# Patient Record
Sex: Female | Born: 1990 | Race: Black or African American | Hispanic: No | Marital: Married | Smoking: Never smoker
Health system: Southern US, Community
[De-identification: ages and names within clinical notes are randomized; demographics above are authoritative.]

## PROBLEM LIST (undated history)

## (undated) ENCOUNTER — Inpatient Hospital Stay (HOSPITAL_COMMUNITY): Payer: Self-pay

## (undated) DIAGNOSIS — R011 Cardiac murmur, unspecified: Secondary | ICD-10-CM

## (undated) DIAGNOSIS — N83209 Unspecified ovarian cyst, unspecified side: Secondary | ICD-10-CM

## (undated) DIAGNOSIS — D649 Anemia, unspecified: Secondary | ICD-10-CM

## (undated) HISTORY — PX: APPENDECTOMY: SHX54

## (undated) HISTORY — DX: Cardiac murmur, unspecified: R01.1

## (undated) HISTORY — PX: CHOLECYSTECTOMY: SHX55

## (undated) HISTORY — DX: Unspecified ovarian cyst, unspecified side: N83.209

## (undated) HISTORY — PX: HERNIA REPAIR: SHX51

## (undated) HISTORY — DX: Anemia, unspecified: D64.9

---

## 2011-11-18 ENCOUNTER — Ambulatory Visit (INDEPENDENT_AMBULATORY_CARE_PROVIDER_SITE_OTHER): Payer: Managed Care, Other (non HMO) | Admitting: Internal Medicine

## 2011-11-18 VITALS — BP 143/86 | HR 88 | Temp 98.5°F | Resp 16 | Ht 62.0 in | Wt 152.0 lb

## 2011-11-18 DIAGNOSIS — L509 Urticaria, unspecified: Secondary | ICD-10-CM

## 2011-11-18 MED ORDER — CETIRIZINE HCL 10 MG PO TABS: 10.0000 mg | ORAL_TABLET | Freq: Every day | ORAL | Status: DC

## 2011-11-18 NOTE — Progress Notes (Signed)
  Subjective:    Patient ID: Robin Berry, female    DOB: 10-25-1990, 21 y.o.   MRN: [id number]  CC: 21 yo c/o rash  HPI Yesterday the pt noticed a itchy rash on R arm, abdomen and lower extremities.  She has not eaten any new foods, or used any new products.  She has noticed having dry itchy eyes, but denies wheezing, sore throat, runny nose, and having any associated symptoms.  She feels well other than this rash. No fever  Review of Systems Noncontributory    Objective:   Physical Exam General: 21 yo appears calm and cooperative with exam. Vitals:  Filed Vitals:   11/18/11 2021  BP: 143/86  Pulse: 88  Temp: 98.5 F (36.9 C)  Resp: 16  HEENT: Non-traumatic, EOMIT, Ears/Nose normal to external exam, Pharynx pink and without swelling, trachea midline Heart: RRR Lungs: CTA bilaterally w/o wheezing and in no acute respiratory distress MSK: Normal bulk and tone Neuro: Alert, oriented, CN II - XII grossly IT Skin: Fine/diffuse papular rash with mild erythema /no vesicles      Assessment & Plan:  Allergic exanthem? Etiology  Meds ordered this encounter  Medications  . cetirizine (ZYRTEC) 10 MG tablet    Sig: Take 1 tablet (10 mg total) by mouth daily.    Dispense:  30 tablet    Refill:  0   Followup 10-14 days if not resolved

## 2012-08-26 ENCOUNTER — Ambulatory Visit (INDEPENDENT_AMBULATORY_CARE_PROVIDER_SITE_OTHER): Payer: Managed Care, Other (non HMO) | Admitting: Family Medicine

## 2012-08-26 VITALS — BP 118/72 | HR 83 | Temp 98.3°F | Resp 18 | Ht 61.0 in | Wt 155.0 lb

## 2012-08-26 DIAGNOSIS — L509 Urticaria, unspecified: Secondary | ICD-10-CM

## 2012-08-26 LAB — GLUCOSE, POCT (MANUAL RESULT ENTRY): POC Glucose: 87 mg/dl (ref 70–99)

## 2012-08-26 MED ORDER — PREDNISONE 20 MG PO TABS: 40.0000 mg | ORAL_TABLET | Freq: Every day | ORAL | Status: DC

## 2012-08-26 NOTE — Progress Notes (Signed)
Subjective:    Patient ID: Robin Berry, female    DOB: [birth year], 22 y.o.   MRN: [id number]  HPI Robin Berry is a 22 y.o. female  Hives - since 3 days ago.  Initially on neck, now on arms, chest/back and thighs since yesterday. No trouble breathing. Taking benadryl twice per day and benadryl cream  - no relief. Started zyrtec today.   Treated for Urticaria 11/18/11 with Zyrtec.   Review of Systems  Constitutional: Negative for fever and chills.  Respiratory: Negative for chest tightness, shortness of breath and wheezing.   Cardiovascular: Negative for chest pain.       Hx of heart murmur - has appt with specialist next month.  Skin: Positive for rash.        Objective:   Physical Exam  Constitutional: She is oriented to person, place, and time. She appears well-developed and well-nourished. No distress.  HENT:  Head: Normocephalic and atraumatic.  Mouth/Throat: Oropharynx is clear and moist. No oral lesions. No oropharyngeal exudate.  Pulmonary/Chest: Effort normal and breath sounds normal. No stridor. No respiratory distress. She has no wheezes.  Neurological: She is alert and oriented to person, place, and time.  Skin: Skin is warm and dry. Rash noted. No petechiae noted. Rash is urticarial.     Few scattered urticaria - neck, upper back, arms, lower legs, few on face.   Psychiatric: She has a normal mood and affect. Her behavior is normal.    Results for orders placed in visit on 08/26/12  GLUCOSE, POCT (MANUAL RESULT ENTRY)      Result Value Range   POC Glucose 87  70 - 99 mg/dl      Assessment & Plan:  Robin Berry is a 22 y.o. female Hives - Plan: POCT glucose (manual entry), predniSONE (DELTASONE) 20 MG tablet  Progressed today.  Will treat with prednisone 40mg  QD for 5 days - SED. Continue benadryl, add zantac, discussed allergy testing - declined at present. rtc precautions discussed.   Meds ordered this encounter  Medications  .  DiphenhydrAMINE HCl (BENADRYL ALLERGY PO)    Sig: Take by mouth.  . predniSONE (DELTASONE) 20 MG tablet    Sig: Take 2 tablets (40 mg total) by mouth daily.    Dispense:  10 tablet    Refill:  0   Patient Instructions  Ok to continue benadryl - up to every 4-6 hours until hives improve, then cn change to zyrtec - 10mg  once per day.  Zantac 150mg  twice per day until hives improve. Return to the clinic or go to the nearest emergency room if any of your symptoms worsen or new symptoms occur. If symptoms recur - consider allergist evaluation and allergy testing.   Hives Hives are itchy, red, swollen areas of the skin. They can vary in size and location on your body. Hives can come and go for hours or several days (acute hives) or for several weeks (chronic hives). Hives do not spread from person to person (noncontagious). They may get worse with scratching, exercise, and emotional stress. CAUSES   Allergic reaction to food, additives, or drugs.  Infections, including the common cold.  Illness, such as vasculitis, lupus, or thyroid disease.  Exposure to sunlight, heat, or cold.  Exercise.  Stress.  Contact with chemicals. SYMPTOMS   Red or white swollen patches on the skin. The patches may change size, shape, and location quickly and repeatedly.  Itching.  Swelling of the hands, feet, and face. This may occur  if hives develop deeper in the skin. DIAGNOSIS  Your caregiver can usually tell what is wrong by performing a physical exam. Skin or blood tests may also be done to determine the cause of your hives. In some cases, the cause cannot be determined. TREATMENT  Mild cases usually get better with medicines such as antihistamines. Severe cases may require an emergency epinephrine injection. If the cause of your hives is known, treatment includes avoiding that trigger.  HOME CARE INSTRUCTIONS   Avoid causes that trigger your hives.  Take antihistamines as directed by your caregiver  to reduce the severity of your hives. Non-sedating or low-sedating antihistamines are usually recommended. Do not drive while taking an antihistamine.  Take any other medicines prescribed for itching as directed by your caregiver.  Wear loose-fitting clothing.  Keep all follow-up appointments as directed by your caregiver. SEEK MEDICAL CARE IF:   You have persistent or severe itching that is not relieved with medicine.  You have painful or swollen joints. SEEK IMMEDIATE MEDICAL CARE IF:   You have a fever.  Your tongue or lips are swollen.  You have trouble breathing or swallowing.  You feel tightness in the throat or chest.  You have abdominal pain. These problems may be the first sign of a life-threatening allergic reaction. Call your local emergency services (911 in [place] MAKE SURE YOU:   Understand these instructions.  Will watch your condition.  Will get help right away if you are not doing well or get worse. Document Released: 01/07/2005 Document Revised: 07/09/2011 Document Reviewed: 04/02/2011 [facility] Patient Information 2014 [place], [place].

## 2012-08-26 NOTE — Patient Instructions (Signed)
Ok to continue benadryl - up to every 4-6 hours until hives improve, then cn change to zyrtec - 10mg  once per day.  Zantac 150mg  twice per day until hives improve. Return to the clinic or go to the nearest emergency room if any of your symptoms worsen or new symptoms occur. If symptoms recur - consider allergist evaluation and allergy testing.   Hives Hives are itchy, red, swollen areas of the skin. They can vary in size and location on your body. Hives can come and go for hours or several days (acute hives) or for several weeks (chronic hives). Hives do not spread from person to person (noncontagious). They may get worse with scratching, exercise, and emotional stress. CAUSES   Allergic reaction to food, additives, or drugs.  Infections, including the common cold.  Illness, such as vasculitis, lupus, or thyroid disease.  Exposure to sunlight, heat, or cold.  Exercise.  Stress.  Contact with chemicals. SYMPTOMS   Red or white swollen patches on the skin. The patches may change size, shape, and location quickly and repeatedly.  Itching.  Swelling of the hands, feet, and face. This may occur if hives develop deeper in the skin. DIAGNOSIS  Your caregiver can usually tell what is wrong by performing a physical exam. Skin or blood tests may also be done to determine the cause of your hives. In some cases, the cause cannot be determined. TREATMENT  Mild cases usually get better with medicines such as antihistamines. Severe cases may require an emergency epinephrine injection. If the cause of your hives is known, treatment includes avoiding that trigger.  HOME CARE INSTRUCTIONS   Avoid causes that trigger your hives.  Take antihistamines as directed by your caregiver to reduce the severity of your hives. Non-sedating or low-sedating antihistamines are usually recommended. Do not drive while taking an antihistamine.  Take any other medicines prescribed for itching as directed by your  caregiver.  Wear loose-fitting clothing.  Keep all follow-up appointments as directed by your caregiver. SEEK MEDICAL CARE IF:   You have persistent or severe itching that is not relieved with medicine.  You have painful or swollen joints. SEEK IMMEDIATE MEDICAL CARE IF:   You have a fever.  Your tongue or lips are swollen.  You have trouble breathing or swallowing.  You feel tightness in the throat or chest.  You have abdominal pain. These problems may be the first sign of a life-threatening allergic reaction. Call your local emergency services (911 in [place] MAKE SURE YOU:   Understand these instructions.  Will watch your condition.  Will get help right away if you are not doing well or get worse. Document Released: 01/07/2005 Document Revised: 07/09/2011 Document Reviewed: 04/02/2011 [facility] Patient Information 2014 [place], [place].

## 2012-10-26 LAB — OB RESULTS CONSOLE ABO/RH: RH TYPE: POSITIVE

## 2012-10-26 LAB — OB RESULTS CONSOLE RPR: RPR: NONREACTIVE

## 2012-10-26 LAB — OB RESULTS CONSOLE ANTIBODY SCREEN: Antibody Screen: NEGATIVE

## 2012-10-26 LAB — OB RESULTS CONSOLE HIV ANTIBODY (ROUTINE TESTING): HIV: NONREACTIVE

## 2012-10-26 LAB — OB RESULTS CONSOLE HEPATITIS B SURFACE ANTIGEN: Hepatitis B Surface Ag: NEGATIVE

## 2012-10-26 LAB — OB RESULTS CONSOLE RUBELLA ANTIBODY, IGM: Rubella: IMMUNE

## 2012-11-12 LAB — OB RESULTS CONSOLE GC/CHLAMYDIA
Chlamydia: NEGATIVE
GC PROBE AMP, GENITAL: NEGATIVE

## 2012-12-07 ENCOUNTER — Inpatient Hospital Stay (HOSPITAL_COMMUNITY)
Admission: AD | Admit: 2012-12-07 | Discharge: 2012-12-07 | Disposition: A | Discharge status: Home / Self Care | Payer: Managed Care, Other (non HMO) | Source: Ambulatory Visit | Attending: Obstetrics and Gynecology | Admitting: Obstetrics and Gynecology

## 2012-12-07 ENCOUNTER — Inpatient Hospital Stay (HOSPITAL_COMMUNITY): Payer: Managed Care, Other (non HMO)

## 2012-12-07 ENCOUNTER — Encounter (HOSPITAL_COMMUNITY): Payer: Self-pay

## 2012-12-07 DIAGNOSIS — R1013 Epigastric pain: Secondary | ICD-10-CM | POA: Insufficient documentation

## 2012-12-07 DIAGNOSIS — O99891 Other specified diseases and conditions complicating pregnancy: Secondary | ICD-10-CM | POA: Insufficient documentation

## 2012-12-07 DIAGNOSIS — K5900 Constipation, unspecified: Secondary | ICD-10-CM | POA: Insufficient documentation

## 2012-12-07 DIAGNOSIS — K219 Gastro-esophageal reflux disease without esophagitis: Secondary | ICD-10-CM | POA: Insufficient documentation

## 2012-12-07 DIAGNOSIS — O210 Mild hyperemesis gravidarum: Secondary | ICD-10-CM | POA: Insufficient documentation

## 2012-12-07 DIAGNOSIS — Z9089 Acquired absence of other organs: Secondary | ICD-10-CM | POA: Insufficient documentation

## 2012-12-07 LAB — URINALYSIS, ROUTINE W REFLEX MICROSCOPIC
Ketones, ur: NEGATIVE mg/dL
Nitrite: NEGATIVE
Specific Gravity, Urine: 1.02 (ref 1.005–1.030)
Urobilinogen, UA: 1 mg/dL (ref 0.0–1.0)
pH: 7 (ref 5.0–8.0)

## 2012-12-07 LAB — URINE MICROSCOPIC-ADD ON

## 2012-12-07 NOTE — MAU Provider Note (Signed)
History     CSN: [id number]  Arrival date and time: 12/07/12 1707   None     Chief Complaint  Patient presents with  . Abdominal Pain   HPI Comments: G1 @ [redacted]w[redacted]d with spastic epigastric pain beginning at noon today. Emesis x3 throughout the day. Also c/o mild lower abdominal cramping started @ 1700. Went 5 days w/o BM then had small BM this am. H/o chronic constipation since cholecystectomy. No VB or LOF. C/o reflux prior to pregnancy and currently.  Abdominal Pain Associated symptoms include constipation, nausea and vomiting. Pertinent negatives include no diarrhea.    OB History   Grav Para Term Preterm Abortions TAB SAB Ect Mult Living   1               Past Medical History  Diagnosis Date  . Anemia     Past Surgical History  Procedure Laterality Date  . Appendectomy    . Cholecystectomy      History reviewed. No pertinent family history.  History  Substance Use Topics  . Smoking status: Never Smoker   . Smokeless tobacco: Not on file  . Alcohol Use: No    Allergies:  Allergies  Allergen Reactions  . Lemon Flavor Shortness Of Breath and Swelling    Mouth and throat swelling     Prescriptions prior to admission  Medication Sig Dispense Refill  . Prenatal Vit-Fe Fumarate-FA (PRENATAL MULTIVITAMIN) TABS tablet Take 1 tablet by mouth at bedtime.        Review of Systems  Constitutional: Negative.   HENT: Negative.   Eyes: Negative.   Respiratory: Negative.   Cardiovascular: Negative.   Gastrointestinal: Positive for heartburn, nausea, vomiting, abdominal pain and constipation. Negative for diarrhea.  Genitourinary: Negative.   Musculoskeletal: Negative.   Skin: Negative.   Neurological: Negative.   Endo/Heme/Allergies: Negative.   Psychiatric/Behavioral: Negative.    Physical Exam   Blood pressure 136/79, pulse 111, temperature 99.6 F (37.6 C), temperature source Oral, resp. rate 16, height 5' 0.5" (1.537 m), weight 77.565 kg (171 lb), last  menstrual period 09/01/2012, SpO2 98.00%.  Physical Exam  Constitutional: She is oriented to person, place, and time. She appears well-developed and well-nourished.  HENT:  Head: Normocephalic.  Eyes: Pupils are equal, round, and reactive to light.  Neck: Normal range of motion.  Cardiovascular: Normal rate and regular rhythm.   Respiratory: Effort normal and breath sounds normal.  GI: Soft. Bowel sounds are normal. She exhibits distension.  Genitourinary: Vagina normal and uterus normal.  Musculoskeletal: Normal range of motion.  Neurological: She is alert and oriented to person, place, and time.  Skin: Skin is warm and dry.  Psychiatric: She has a normal mood and affect.  SVE: 0/0/high  MAU Course  Procedures Abdominal Ultrasound-  CLINICAL DATA: Right upper abdominal pain, pregnant.  EXAM:  COMPLETE ABDOMINAL ULTRASOUND  COMPARISON: None available  FINDINGS:  Gallbladder: Surgically absent  Common bile duct: Normal in caliber, 1.72mm diameter.  Liver: Homogeneous in echotexture without focal lesion or  intrahepatic bile duct dilatation.  IVC: Negative  Pancreas: Negative  Spleen: No focal lesion, 10.3cm in length.  Right Kidney: No mass or hydronephrosis, 10.0cm in length.  Left Kidney: No lesion or hydronephrosis, 9.7cm in length.  Abdominal aorta: Negative  IMPRESSION:  Negative, post cholecystectomy.  Electronically Signed  By: Oley Balm M.D.  On: 12/07/2012 19:54   Assessment and Plan  [redacted] wk gestation Constipation GERD  Discharge home. Miralax and Magnesium Oxide daily for  constipation. Increase water and fiber intake. Exercise regularly. Zantac daily. SAB precautions discussed.  Eriana Suliman, N 12/07/2012, 6:11 PM

## 2012-12-07 NOTE — MAU Note (Signed)
Patient states that she started having upper abdominal pain at 1230 that is not going away. Has vomited x 3 since the pain started. Denies bleeding or discharge but has started having lower abdominal pain.

## 2012-12-07 NOTE — MAU Note (Signed)
Pt presents to MAU with c/o upper abdominal pain since 1230 today. Pt states it started gradually but became sever in about 30 minutes. Pt has had her gallbladder removed and states this is the same type of pain that she experienced then.

## 2013-03-18 ENCOUNTER — Inpatient Hospital Stay (HOSPITAL_COMMUNITY)
Admission: AD | Admit: 2013-03-18 | Payer: Managed Care, Other (non HMO) | Source: Ambulatory Visit | Admitting: Obstetrics & Gynecology

## 2013-04-30 LAB — OB RESULTS CONSOLE GBS: GBS: NEGATIVE

## 2013-05-19 ENCOUNTER — Inpatient Hospital Stay (HOSPITAL_COMMUNITY)
Admission: AD | Admit: 2013-05-19 | Discharge: 2013-05-19 | Disposition: A | Discharge status: Home / Self Care | Payer: Managed Care, Other (non HMO) | Source: Ambulatory Visit | Attending: Obstetrics & Gynecology | Admitting: Obstetrics & Gynecology

## 2013-05-19 ENCOUNTER — Encounter (HOSPITAL_COMMUNITY): Payer: Self-pay | Admitting: *Deleted

## 2013-05-19 DIAGNOSIS — O368390 Maternal care for abnormalities of the fetal heart rate or rhythm, unspecified trimester, not applicable or unspecified: Secondary | ICD-10-CM | POA: Insufficient documentation

## 2013-05-19 NOTE — MAU Note (Signed)
Sent from office, having decels.  No real pain, feeling pelvic pressure. No bleeding no leaking.

## 2013-05-19 NOTE — MAU Provider Note (Signed)
  History     CSN: [id number]  Arrival date and time: 05/19/13 1025   None    Chief Complaint  Patient presents with  . non-reactive fetal tracing    HPI 38.[redacted] wks gestation, patient was seen in office for her routine Ob visit and complaint of decreased Fms. Office NST noted possible variable decel vs interrupted tracing with maternal heart rate pick up.  Past Medical History  Diagnosis Date  . Anemia     Past Surgical History  Procedure Laterality Date  . Appendectomy    . Cholecystectomy      History reviewed. No pertinent family history.  History  Substance Use Topics  . Smoking status: Never Smoker   . Smokeless tobacco: Not on file  . Alcohol Use: No    Allergies:  Allergies  Allergen Reactions  . Lemon Flavor Shortness Of Breath and Swelling    Mouth and throat swelling     Prescriptions prior to admission  Medication Sig Dispense Refill  . ferrous sulfate 325 (65 FE) MG tablet Take 325 mg by mouth daily.      Marland Kitchen. loratadine (CLARITIN) 10 MG tablet Take 10 mg by mouth as needed for allergies.      . Prenatal Vit-Fe Fumarate-FA (PRENATAL MULTIVITAMIN) TABS tablet Take 1 tablet by mouth at bedtime.        ROS Physical Exam   Height 5\' 1"  (1.549 m), weight 199 lb (90.266 kg), last menstrual period 09/01/2012.  Physical Exam  A&O x 3, no acute distress. Pleasant FHT  - Category i. NST reactive.  Toco present, occasional.  MAU Course  Procedures Prolonged fetal monitoring with NO decels.    Assessment and Plan  Reassuring FHT and fetal status, NST reactive. See me in office on 5/1  Robley FriesVaishali R Latrice Storlie 05/19/2013, 12:31 PM

## 2013-05-25 ENCOUNTER — Encounter (HOSPITAL_COMMUNITY): Payer: Self-pay | Admitting: *Deleted

## 2013-05-25 ENCOUNTER — Encounter (HOSPITAL_COMMUNITY): Payer: Self-pay | Admitting: General Practice

## 2013-05-25 ENCOUNTER — Inpatient Hospital Stay (HOSPITAL_COMMUNITY)
Admission: AD | Admit: 2013-05-25 | Discharge: 2013-05-25 | Disposition: A | Discharge status: Home / Self Care | Payer: Managed Care, Other (non HMO) | Source: Ambulatory Visit | Attending: Obstetrics | Admitting: Obstetrics

## 2013-05-25 ENCOUNTER — Inpatient Hospital Stay (HOSPITAL_COMMUNITY)
Admission: AD | Admit: 2013-05-25 | Discharge: 2013-05-26 | Disposition: A | Discharge status: Home / Self Care | Payer: Managed Care, Other (non HMO) | Source: Ambulatory Visit | Attending: Obstetrics | Admitting: Obstetrics

## 2013-05-25 DIAGNOSIS — O479 False labor, unspecified: Secondary | ICD-10-CM | POA: Insufficient documentation

## 2013-05-25 NOTE — MAU Note (Signed)
Pt was seen by DR. Mody today in the office at 1000am. Pt was checked by MD and was told she was 2 cm. Pt states she has been leaking fluid since 8:00 pm last night. States on test in the office was positive and one was  Negative for rom.

## 2013-05-25 NOTE — MAU Note (Signed)
Pt states she started having contractions 3am today

## 2013-05-25 NOTE — Progress Notes (Signed)
CC: labor check.  HPI: 23 yo G1 at 39'2 presents w/ ctx since 3 am. Pt notes ctx q 10-15 min. Pt notes leaking fluid earlier this am, none currently. Seen in office earlier today.- fern/ pool/ nitrizine neg and cvx at 2 cm. Pt sent walking and returns now for re-eval. Good FM, no VB.  PMH: anemia, constipation, anal fissures PSH: chole, appy PN issues: eleavted wt gain 40+#, GBS neg, nl DS, nl quaD  Pe: Filed Vitals:   05/25/13 1439 05/25/13 1444 05/25/13 1449 05/25/13 1454  BP:      Pulse: 99 93 93 102  Temp:      TempSrc:      Resp:      Gen: well appearing, no distress Abd: gravid, NT LE: NT, no edema Cvx: 2/50%/ vtx -3/ mid/ med/ intact membranes palpated GU: no pool, gel and cervical mucous noted  Toco: q 5min FH: 140s, + accels, no decels, 10 beat var  Fern: neg  A/P: Latent labor - reasurring fetal testing GBS neg Expectant management Awaiting full vital- asked nurse to check now.   Olita Takeshita A. Neng Albee 05/25/2013 3:21 PM

## 2013-05-25 NOTE — Discharge Instructions (Signed)

## 2013-05-26 ENCOUNTER — Other Ambulatory Visit: Payer: Self-pay | Admitting: Obstetrics & Gynecology

## 2013-05-26 MED ORDER — ZOLPIDEM TARTRATE 5 MG PO TABS
5.0000 mg | ORAL_TABLET | Freq: Once | ORAL | Status: AC
  Administered 2013-05-26: 5 mg via ORAL
  Filled 2013-05-26: qty 1

## 2013-05-26 NOTE — Progress Notes (Signed)
Pt lying on left side for comfort while BP taking.

## 2013-05-26 NOTE — Progress Notes (Signed)
Pt self positioning from right side lying position to left side lying position

## 2013-05-26 NOTE — Discharge Instructions (Signed)

## 2013-05-27 ENCOUNTER — Inpatient Hospital Stay (HOSPITAL_COMMUNITY)
Admission: RE | Admit: 2013-05-27 | Discharge: 2013-05-30 | DRG: 765 | Disposition: A | Discharge status: Home / Self Care | Payer: Managed Care, Other (non HMO) | Source: Ambulatory Visit | Attending: Obstetrics & Gynecology | Admitting: Obstetrics & Gynecology

## 2013-05-27 ENCOUNTER — Encounter (HOSPITAL_COMMUNITY): Payer: Self-pay

## 2013-05-27 ENCOUNTER — Encounter (HOSPITAL_COMMUNITY): Payer: Managed Care, Other (non HMO) | Admitting: Anesthesiology

## 2013-05-27 ENCOUNTER — Encounter (HOSPITAL_COMMUNITY)
Admission: RE | Disposition: A | Discharge status: Home / Self Care | Payer: Self-pay | Source: Ambulatory Visit | Attending: Obstetrics & Gynecology

## 2013-05-27 ENCOUNTER — Inpatient Hospital Stay (HOSPITAL_COMMUNITY): Payer: Managed Care, Other (non HMO) | Admitting: Anesthesiology

## 2013-05-27 DIAGNOSIS — O99214 Obesity complicating childbirth: Secondary | ICD-10-CM

## 2013-05-27 DIAGNOSIS — O409XX0 Polyhydramnios, unspecified trimester, not applicable or unspecified: Principal | ICD-10-CM | POA: Diagnosis present

## 2013-05-27 DIAGNOSIS — O9902 Anemia complicating childbirth: Secondary | ICD-10-CM | POA: Diagnosis present

## 2013-05-27 DIAGNOSIS — Z6837 Body mass index (BMI) 37.0-37.9, adult: Secondary | ICD-10-CM

## 2013-05-27 DIAGNOSIS — E669 Obesity, unspecified: Secondary | ICD-10-CM | POA: Diagnosis present

## 2013-05-27 DIAGNOSIS — O332 Maternal care for disproportion due to inlet contraction of pelvis: Secondary | ICD-10-CM | POA: Diagnosis present

## 2013-05-27 DIAGNOSIS — O411090 Infection of amniotic sac and membranes, unspecified, unspecified trimester, not applicable or unspecified: Secondary | ICD-10-CM | POA: Diagnosis present

## 2013-05-27 DIAGNOSIS — D649 Anemia, unspecified: Secondary | ICD-10-CM | POA: Diagnosis present

## 2013-05-27 LAB — CBC
HCT: 35 % — ABNORMAL LOW (ref 36.0–46.0)
Hemoglobin: 11.8 g/dL — ABNORMAL LOW (ref 12.0–15.0)
MCH: 30.9 pg (ref 26.0–34.0)
MCHC: 33.7 g/dL (ref 30.0–36.0)
MCV: 91.6 fL (ref 78.0–100.0)
Platelets: 210 10*3/uL (ref 150–400)
RBC: 3.82 MIL/uL — ABNORMAL LOW (ref 3.87–5.11)
RDW: 12.9 % (ref 11.5–15.5)
WBC: 12.1 10*3/uL — ABNORMAL HIGH (ref 4.0–10.5)

## 2013-05-27 LAB — TYPE AND SCREEN
ABO/RH(D): A POS
ANTIBODY SCREEN: NEGATIVE

## 2013-05-27 LAB — RPR

## 2013-05-27 LAB — ABO/RH: ABO/RH(D): A POS

## 2013-05-27 SURGERY — Surgical Case
Anesthesia: Epidural | Site: Abdomen

## 2013-05-27 MED ORDER — BUTORPHANOL TARTRATE 1 MG/ML IJ SOLN
1.0000 mg | INTRAMUSCULAR | Status: DC | PRN
  Administered 2013-05-27 (×2): 1 mg via INTRAVENOUS
  Filled 2013-05-27 (×2): qty 1

## 2013-05-27 MED ORDER — OXYTOCIN 40 UNITS IN LACTATED RINGERS INFUSION - SIMPLE MED
1.0000 m[IU]/min | INTRAVENOUS | Status: DC
  Administered 2013-05-27: 4 m[IU]/min via INTRAVENOUS
  Filled 2013-05-27: qty 1000

## 2013-05-27 MED ORDER — OXYTOCIN 10 UNIT/ML IJ SOLN
INTRAMUSCULAR | Status: AC
  Filled 2013-05-27: qty 4

## 2013-05-27 MED ORDER — MORPHINE SULFATE 0.5 MG/ML IJ SOLN
INTRAMUSCULAR | Status: AC
  Filled 2013-05-27: qty 10

## 2013-05-27 MED ORDER — ONDANSETRON HCL 4 MG/2ML IJ SOLN: 4.0000 mg | Freq: Four times a day (QID) | INTRAMUSCULAR | Status: DC | PRN

## 2013-05-27 MED ORDER — SODIUM BICARBONATE 8.4 % IV SOLN
INTRAVENOUS | Status: AC
  Filled 2013-05-27: qty 50

## 2013-05-27 MED ORDER — PHENYLEPHRINE 40 MCG/ML (10ML) SYRINGE FOR IV PUSH (FOR BLOOD PRESSURE SUPPORT): 80.0000 ug | PREFILLED_SYRINGE | INTRAVENOUS | Status: DC | PRN

## 2013-05-27 MED ORDER — FLEET ENEMA 7-19 GM/118ML RE ENEM: 1.0000 | ENEMA | RECTAL | Status: DC | PRN

## 2013-05-27 MED ORDER — FENTANYL CITRATE 0.05 MG/ML IJ SOLN
INTRAMUSCULAR | Status: DC | PRN
  Administered 2013-05-27: 100 ug via EPIDURAL

## 2013-05-27 MED ORDER — FENTANYL CITRATE 0.05 MG/ML IJ SOLN
INTRAMUSCULAR | Status: AC
  Filled 2013-05-27: qty 2

## 2013-05-27 MED ORDER — LIDOCAINE HCL (PF) 1 % IJ SOLN: 30.0000 mL | INTRAMUSCULAR | Status: DC | PRN

## 2013-05-27 MED ORDER — ONDANSETRON HCL 4 MG/2ML IJ SOLN
INTRAMUSCULAR | Status: AC
  Filled 2013-05-27: qty 2

## 2013-05-27 MED ORDER — CITRIC ACID-SODIUM CITRATE 334-500 MG/5ML PO SOLN
30.0000 mL | ORAL | Status: DC | PRN
  Administered 2013-05-27: 30 mL via ORAL
  Filled 2013-05-27: qty 15

## 2013-05-27 MED ORDER — ACETAMINOPHEN 325 MG PO TABS
650.0000 mg | ORAL_TABLET | ORAL | Status: DC | PRN
  Administered 2013-05-27: 650 mg via ORAL
  Filled 2013-05-27: qty 2

## 2013-05-27 MED ORDER — BUPIVACAINE HCL (PF) 0.25 % IJ SOLN: INTRAMUSCULAR | Status: DC | PRN

## 2013-05-27 MED ORDER — IBUPROFEN 600 MG PO TABS: 600.0000 mg | ORAL_TABLET | Freq: Four times a day (QID) | ORAL | Status: DC | PRN

## 2013-05-27 MED ORDER — OXYTOCIN 40 UNITS IN LACTATED RINGERS INFUSION - SIMPLE MED: 62.5000 mL/h | INTRAVENOUS | Status: DC

## 2013-05-27 MED ORDER — OXYCODONE-ACETAMINOPHEN 5-325 MG PO TABS: 1.0000 | ORAL_TABLET | ORAL | Status: DC | PRN

## 2013-05-27 MED ORDER — LACTATED RINGERS IV SOLN
INTRAVENOUS | Status: DC
  Administered 2013-05-27 (×4): via INTRAVENOUS

## 2013-05-27 MED ORDER — FENTANYL CITRATE 0.05 MG/ML IJ SOLN
INTRAMUSCULAR | Status: DC | PRN
Start: 2013-05-27 — End: 2013-05-27
  Administered 2013-05-27: 100 ug via INTRAVENOUS

## 2013-05-27 MED ORDER — ONDANSETRON HCL 4 MG/2ML IJ SOLN
INTRAMUSCULAR | Status: DC | PRN
  Administered 2013-05-27: 4 mg via INTRAVENOUS

## 2013-05-27 MED ORDER — DIPHENHYDRAMINE HCL 50 MG/ML IJ SOLN: 12.5000 mg | INTRAMUSCULAR | Status: DC | PRN

## 2013-05-27 MED ORDER — LIDOCAINE-EPINEPHRINE (PF) 2 %-1:200000 IJ SOLN
INTRAMUSCULAR | Status: AC
  Filled 2013-05-27: qty 20

## 2013-05-27 MED ORDER — PHENYLEPHRINE 40 MCG/ML (10ML) SYRINGE FOR IV PUSH (FOR BLOOD PRESSURE SUPPORT)
80.0000 ug | PREFILLED_SYRINGE | INTRAVENOUS | Status: DC | PRN
  Filled 2013-05-27: qty 10

## 2013-05-27 MED ORDER — LACTATED RINGERS IV SOLN
500.0000 mL | INTRAVENOUS | Status: DC | PRN
  Administered 2013-05-27: 500 mL via INTRAVENOUS

## 2013-05-27 MED ORDER — FENTANYL 2.5 MCG/ML BUPIVACAINE 1/10 % EPIDURAL INFUSION (WH - ANES)
14.0000 mL/h | INTRAMUSCULAR | Status: DC | PRN
  Administered 2013-05-27 (×3): 14 mL/h via EPIDURAL
  Filled 2013-05-27 (×3): qty 125

## 2013-05-27 MED ORDER — MORPHINE SULFATE (PF) 0.5 MG/ML IJ SOLN
INTRAMUSCULAR | Status: DC | PRN
  Administered 2013-05-27: 3.5 mg via EPIDURAL

## 2013-05-27 MED ORDER — TERBUTALINE SULFATE 1 MG/ML IJ SOLN: 0.2500 mg | Freq: Once | INTRAMUSCULAR | Status: AC | PRN

## 2013-05-27 MED ORDER — OXYTOCIN BOLUS FROM INFUSION
500.0000 mL | INTRAVENOUS | Status: DC
Start: 2013-05-27 — End: 2013-05-28

## 2013-05-27 MED ORDER — SODIUM BICARBONATE 8.4 % IV SOLN
INTRAVENOUS | Status: DC | PRN
  Administered 2013-05-27 (×6): 5 mL via EPIDURAL

## 2013-05-27 MED ORDER — EPHEDRINE 5 MG/ML INJ: 10.0000 mg | INTRAVENOUS | Status: DC | PRN

## 2013-05-27 MED ORDER — FENTANYL 2.5 MCG/ML BUPIVACAINE 1/10 % EPIDURAL INFUSION (WH - ANES): 14.0000 mL/h | INTRAMUSCULAR | Status: DC | PRN

## 2013-05-27 MED ORDER — DEXTROSE 5 % IV SOLN
2.0000 g | Freq: Four times a day (QID) | INTRAVENOUS | Status: DC
  Administered 2013-05-27: 2 g via INTRAVENOUS
  Filled 2013-05-27 (×4): qty 2

## 2013-05-27 MED ORDER — LACTATED RINGERS IV SOLN: 500.0000 mL | Freq: Once | INTRAVENOUS | Status: DC

## 2013-05-27 MED ORDER — EPHEDRINE 5 MG/ML INJ
10.0000 mg | INTRAVENOUS | Status: DC | PRN
  Filled 2013-05-27: qty 4

## 2013-05-27 MED ORDER — MORPHINE SULFATE (PF) 0.5 MG/ML IJ SOLN
INTRAMUSCULAR | Status: DC | PRN
  Administered 2013-05-27: 1.5 mg via EPIDURAL

## 2013-05-27 SURGICAL SUPPLY — 38 items
BENZOIN TINCTURE PRP APPL 2/3 (GAUZE/BANDAGES/DRESSINGS) ×3 IMPLANT
CLAMP CORD UMBIL (MISCELLANEOUS) IMPLANT
CLOSURE WOUND 1/2 X4 (GAUZE/BANDAGES/DRESSINGS) ×1
CLOTH BEACON ORANGE TIMEOUT ST (SAFETY) ×3 IMPLANT
CONTAINER PREFILL 10% NBF 15ML (MISCELLANEOUS) IMPLANT
DRAPE LG THREE QUARTER DISP (DRAPES) IMPLANT
DRSG OPSITE POSTOP 4X10 (GAUZE/BANDAGES/DRESSINGS) ×3 IMPLANT
DURAPREP 26ML APPLICATOR (WOUND CARE) ×3 IMPLANT
ELECT REM PT RETURN 9FT ADLT (ELECTROSURGICAL) ×3
ELECTRODE REM PT RTRN 9FT ADLT (ELECTROSURGICAL) ×1 IMPLANT
EXTRACTOR VACUUM KIWI (MISCELLANEOUS) IMPLANT
EXTRACTOR VACUUM M CUP 4 TUBE (SUCTIONS) IMPLANT
EXTRACTOR VACUUM M CUP 4' TUBE (SUCTIONS)
GLOVE BIO SURGEON STRL SZ7 (GLOVE) ×3 IMPLANT
GLOVE BIOGEL PI IND STRL 7.0 (GLOVE) ×1 IMPLANT
GLOVE BIOGEL PI INDICATOR 7.0 (GLOVE) ×2
GOWN STRL REUS W/TWL LRG LVL3 (GOWN DISPOSABLE) ×6 IMPLANT
KIT ABG SYR 3ML LUER SLIP (SYRINGE) IMPLANT
NEEDLE HYPO 25X5/8 SAFETYGLIDE (NEEDLE) IMPLANT
NS IRRIG 1000ML POUR BTL (IV SOLUTION) ×3 IMPLANT
PACK C SECTION WH (CUSTOM PROCEDURE TRAY) ×3 IMPLANT
PAD OB MATERNITY 4.3X12.25 (PERSONAL CARE ITEMS) ×3 IMPLANT
RTRCTR C-SECT PINK 25CM LRG (MISCELLANEOUS) IMPLANT
STAPLER VISISTAT 35W (STAPLE) IMPLANT
STRIP CLOSURE SKIN 1/2X4 (GAUZE/BANDAGES/DRESSINGS) ×2 IMPLANT
SUT MON AB-0 CT1 36 (SUTURE) ×9 IMPLANT
SUT PLAIN 0 NONE (SUTURE) IMPLANT
SUT PLAIN 2 0 (SUTURE) ×2
SUT PLAIN ABS 2-0 CT1 27XMFL (SUTURE) ×1 IMPLANT
SUT VIC AB 0 CT1 27 (SUTURE) ×4
SUT VIC AB 0 CT1 27XBRD ANBCTR (SUTURE) ×2 IMPLANT
SUT VIC AB 2-0 CT1 27 (SUTURE) ×4
SUT VIC AB 2-0 CT1 TAPERPNT 27 (SUTURE) ×2 IMPLANT
SUT VIC AB 4-0 KS 27 (SUTURE) ×3 IMPLANT
SUT VICRYL 0 TIES 12 18 (SUTURE) IMPLANT
TOWEL OR 17X24 6PK STRL BLUE (TOWEL DISPOSABLE) ×3 IMPLANT
TRAY FOLEY CATH 14FR (SET/KITS/TRAYS/PACK) IMPLANT
WATER STERILE IRR 1000ML POUR (IV SOLUTION) ×3 IMPLANT

## 2013-05-27 NOTE — Op Note (Signed)
Procedure Note : Primary Low Transverse Cesarean Section   Robin Berry  05/27/2013  Indications: Arrest of dilatation  Pre-operative Diagnosis: 39.4 wks polyhydramnios, Chorioamnionitis, Arrest of Dilatation  Post-operative Diagnosis: Same, Platypelloid pelvis  Surgeon: Robley FriesVaishali R Adalbert Alberto, MD    Assistants: Marlinda Mikeanya Bailey, CNM  Anesthesia: epidural   Procedure Details:  The patient was seen in the Labor Room. The risks, benefits, complications, treatment options, and expected outcomes were discussed with the patient. The patient concurred with the proposed plan, giving informed consent. identified as Robin Berry and the procedure verified as C-Section Delivery. She was brought to the Operating room. A Time Out was held and the above information confirmed.  After induction of epidural anesthesia, the patient was draped and prepped in the usual sterile manner. A Pfannenstiel Incision was made and carried down through the subcutaneous tissue to the fascia. Fascial incision was made and extended transversely. The fascia was separated from the underlying rectus tissue superiorly and inferiorly. The peritoneum was identified and entered. Peritoneal incision was extended longitudinally. The utero-vesical peritoneal reflection was incised transversely and the bladder flap was bluntly freed from the lower uterine segment. Bladder blade would not sit well due to flat/platypelloid pelvis so Alexis-O retractor was placed. A low transverse uterine incision was made. Copious clear fluid drained. Baby was asynclitic in LOT position. Pubic arch was flat. Baby GIRL was delivered with gentle rotation and flexion at 10.32 pm on 05/27/13 with Apgar scores of 8 at one minute and 9 at five minutes. Cord ph was not sent the umbilical cord was clamped and cut cord blood was obtained for evaluation. The placenta was removed Intact and appeared normal. The uterine outline, tubes and ovaries appeared normal. The uterine  incision was closed with running locked sutures of 0Monocryl and a second imbricating layer with same suture.  Hemostasis was observed. Alexis-O retractor was removed. Peritoneal closure done with 2-0 Vicryl. Muscles approximated in lower tendon area. The fascia was then reapproximated with running sutures of 0Vicryl. The subcutaneous closure was performed using 2-0plain gut. The skin was closed with 4-0Vicryl. Steristrips, dressings applied.   Instrument, sponge, and needle counts were correct prior the abdominal closure and were correct at the conclusion of the case.   Findings: Baby GIRL, in LOT position with hyperextended asynclitic head with caput. 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 Delivered at 10.32 pm, vigorous cry and Apgars 8 and 9 at 1minute and 5minutes. Normal tubes, ovaries and placenta with 3 vessel cord.    Estimated Blood Loss: 800 cc  Total IV Fluids: 1100 ml   Urine Output: 100CC OF clear urine  Specimens: Placenta to pathology  Complications: no complications  Disposition: PACU - hemodynamically stable.   Maternal Condition: stable   Baby condition / location:  Couplet care / Skin to Skin  Attending Attestation: I performed the procedure.   Signed: Surgeon(s): Robley FriesVaishali R Nelma Phagan, MD

## 2013-05-27 NOTE — Consult Note (Signed)
[facility]  Delivery Note:  C-section       05/27/2013  10:26 PM  I was called to the operating room at the request of the patient's obstetrician (Dr. Juliene PinaMody) for a primary c-section for arrest of dilation.  PRENATAL HX:  23 y/o G1P0 at 239 and 4/7 weeks.  Pregnancy complicated by recent diagnosis of polyhydramnios.   INTRAPARTUM HX:   IOL at 8239 and 4/[redacted] weeks gestation admitted with prodromal labor for labor augmentation.  GBS negative.  C-section was indicated for arrest of dilation.  She also developed chorioamnionitis.    DELIVERY:  Infant was vigorous at delivery, requiring no resuscitation other than standard warming, drying and stimulation.  APGARs 8 and 9.  Mother diagnosed with chorioamnionitis but exam within normal limits.  After 5 minutes, baby left with nurse to assist parents with skin-to-skin care.   _____________________ Electronically Signed By: Maryan CharLindsey Jayvian Escoe, MD Neonatologist

## 2013-05-27 NOTE — Progress Notes (Signed)
Steffanie RainwaterShontria Chesson is a 23 y.o. G1P0 at 303w4d by   Objective: BP 115/65  Pulse 124  Temp(Src) 101.7 F (38.7 C) (Oral)  Resp 20  Ht 5\' 1"  (1.549 m)  Wt 198 lb (89.812 kg)  BMI 37.43 kg/m2  SpO2 100%  LMP 09/01/2012    FHT:  FHR: 155 bpm, variability: moderate,  accelerations:  Present,  decelerations:  Absent UC:   regular, every 2 minutes SVE:   Dilation: 6 Effacement (%): 80 Station: -1 Exam by:: Dr.Rashawna Scoles Maternal fever 101, FHR change in baseline to 155 but category I   Assessment / Plan: Arrest in active phase of labor at 5- 6 cm with OT, caput noted. Chorioamnionitis, starting Tylenol and Cefoxitin  Fetal Wellbeing:  Category I Pain Control:  Epidural I/D:  n/a Anticipated MOD:  Primary C/section Risks/complications of surgery reviewed incl infection, bleeding, damage to internal organs including bladder, bowels, ureters, blood vessels, other risks from anesthesia, VTE and delayed complications of any surgery, complications in future surgery reviewed. Also discussed neonatal complications incl difficult delivery, laceration, vacuum assistance, TTN etc. Pt understands and agrees, all concerns addressed.    Robley FriesVaishali R Meshawn Oconnor 05/27/2013, 9:18 PM

## 2013-05-27 NOTE — Anesthesia Preprocedure Evaluation (Addendum)
Anesthesia Evaluation  Patient identified by MRN, date of birth, ID band Patient awake    Reviewed: Allergy & Precautions, H&P , Patient's Chart, lab work & pertinent test results  Airway Mallampati: II TM Distance: >3 FB Neck ROM: full    Dental  (+) Teeth Intact   Pulmonary  breath sounds clear to auscultation        Cardiovascular Rhythm:regular Rate:Normal     Neuro/Psych    GI/Hepatic   Endo/Other  Morbid obesity  Renal/GU      Musculoskeletal   Abdominal   Peds  Hematology   Anesthesia Other Findings       Reproductive/Obstetrics (+) Pregnancy (FTP --> C/S)                          Anesthesia Physical Anesthesia Plan  ASA: III and emergent  Anesthesia Plan: Epidural   Post-op Pain Management:    Induction:   Airway Management Planned:   Additional Equipment:   Intra-op Plan:   Post-operative Plan:   Informed Consent: I have reviewed the patients History and Physical, chart, labs and discussed the procedure including the risks, benefits and alternatives for the proposed anesthesia with the patient or authorized representative who has indicated his/her understanding and acceptance.   Dental Advisory Given  Plan Discussed with: Surgeon and CRNA  Anesthesia Plan Comments: (Labs checked- platelets confirmed with RN in room. Fetal heart tracing, per RN, reported to be stable enough for sitting procedure. Discussed epidural, and patient consents to the procedure:  included risk of possible headache,backache, failed block, allergic reaction, and nerve injury. This patient was asked if she had any questions or concerns before the procedure started.)       Anesthesia Quick Evaluation

## 2013-05-27 NOTE — Progress Notes (Signed)
Robin Berry is a 23 y.o. G1P0 at 7560w4d admitted in early labor but was planned to have IOL this morning for polyhydramnios. On pitocin (low dose) and s/p Epidural, comfortable.   Objective: BP 118/71  Pulse 98  Temp(Src) 98.8 F (37.1 C) (Oral)  Resp 18  Ht 5\' 1"  (1.549 m)  Wt 198 lb (89.812 kg)  BMI 37.43 kg/m2  SpO2 100%  LMP 09/01/2012    FHT:  FHR: 140s bpm, variability: moderate,  accelerations:  Present,  decelerations:  Absent UC:   regular, every 3-4 minutes SVE:   Dilation: 4 Effacement (%): 80 Station: -3 Exam by:: Dr Juliene PinaMody IUPC placed  Labs: Lab Results  Component Value Date   WBC 12.1* 05/27/2013   HGB 11.8* 05/27/2013   HCT 35.0* 05/27/2013   MCV 91.6 05/27/2013   PLT 210 05/27/2013    Assessment / Plan: Augmentation of labor, progressing well- but in latent labor  Labor: protracted latent phase Preeclampsia:  none Fetal Wellbeing:  Category I Pain Control:  Epidural I/D:  GBS(-) Anticipated MOD:  Guarded for VD  Robley FriesVaishali R Nekhi Berry 05/27/2013

## 2013-05-27 NOTE — Transfer of Care (Signed)
Immediate Anesthesia Transfer of Care Note  Patient: Robin Berry  Procedure(s) Performed: Procedure(s): CESAREAN SECTION (N/A)  Patient Location: PACU  Anesthesia Type:Epidural  Level of Consciousness: awake, alert  and oriented  Airway & Oxygen Therapy: Patient Spontanous Breathing  Post-op Assessment: Report given to PACU RN and Post -op Vital signs reviewed and stable  Post vital signs: Reviewed and stable  Complications: No apparent anesthesia complications

## 2013-05-27 NOTE — H&P (Signed)
Steffanie RainwaterShontria Chesson is a 23 y.o. female G1 at 39.4 wks with polyhydramnios and prodromal labor for 3 days with several office and MAU visits for labor check. Office sonogram on 5/6 for AFI- was 27 cm, Vtx and hence decision was made for labor induction, cx was favorable at 2 cm. She presented earlier this morning for labor pain and was kept in house to proceed with labor augmentation. She received 2 doses of Stadol to rest and felt better. Pitocin was started this morning.   [facility], primary Dr Juliene PinaMody. Uncomplicated preg, TWG 40 lbs, Normal Glucola 111.   History OB History   Grav Para Term Preterm Abortions TAB SAB Ect Mult Living   1              Past Medical History  Diagnosis Date  . Anemia    Past Surgical History  Procedure Laterality Date  . Appendectomy    . Cholecystectomy     Family History: family history is not on file. Social History:  reports that she has never smoked. She does not have any smokeless tobacco history on file. She reports that she does not drink alcohol. Her drug history is not on file.   Prenatal Transfer Tool  Maternal Diabetes: No Genetic Screening: Normal QUAD normal  Maternal Ultrasounds/Referrals: Normal Fetal Ultrasounds or other Referrals:  None Maternal Substance Abuse:  No Significant Maternal Medications:  None Significant Maternal Lab Results:  Lab values include: Group B Strep negative Other Comments:  None  ROS neg   Blood pressure 119/54, pulse 111, temperature 98.2 F (36.8 C), temperature source Oral, resp. rate 20, height 5\' 1"  (1.549 m), weight 198 lb (89.812 kg), last menstrual period 09/01/2012, SpO2 100.00%. Exam Physical Exam  A&O x 3, no acute distress. Pleasant HEENT neg, no thyromegaly Lungs CTA bilat CV RRR, S1S2 normal Abdo soft, non tender, non acute Extr no edema/ tenderness Pelvic 2-3./60%/-3/ VTX FHT 140s/ + accels/ no decels/ mod variab- reassuring. Category I Toco q 2-3 minutes on 2 mu  pitocin. Controlled AROM done, clear copious fluid, Head well applied to cervix. Pitocin stopped and will restart if UCs spaced out.   Prenatal labs: ABO, Rh: --/--/A POS, A POS (05/07 0130) Antibody: NEG (05/07 0130) Rubella: Immune (10/06 0000) RPR: NON REAC (05/07 0130)  HBsAg: Negative (10/06 0000)  HIV: Non-reactive (10/06 0000)  GBS: Negative (04/10 0000)   Assessment/Plan: 23 yo, G1 at 39.4 wks with polyhydramnios, admitted for labor augmentation. GBS(-). Epidural ok, cont pitocin after UCs space out. Watch descent, EFW 8 lbs.    Robley FriesVaishali R Adrienne Delay 05/27/2013

## 2013-05-28 ENCOUNTER — Encounter (HOSPITAL_COMMUNITY): Payer: Self-pay

## 2013-05-28 LAB — CBC
HCT: 29.7 % — ABNORMAL LOW (ref 36.0–46.0)
HEMOGLOBIN: 9.8 g/dL — AB (ref 12.0–15.0)
MCH: 30.3 pg (ref 26.0–34.0)
MCHC: 33 g/dL (ref 30.0–36.0)
MCV: 92 fL (ref 78.0–100.0)
Platelets: 170 10*3/uL (ref 150–400)
RBC: 3.23 MIL/uL — AB (ref 3.87–5.11)
RDW: 13 % (ref 11.5–15.5)
WBC: 21.6 10*3/uL — AB (ref 4.0–10.5)

## 2013-05-28 MED ORDER — LANOLIN HYDROUS EX OINT: 1.0000 "application " | TOPICAL_OINTMENT | CUTANEOUS | Status: DC | PRN

## 2013-05-28 MED ORDER — NALOXONE HCL 1 MG/ML IJ SOLN
1.0000 ug/kg/h | INTRAMUSCULAR | Status: DC | PRN
  Filled 2013-05-28: qty 2

## 2013-05-28 MED ORDER — OXYCODONE-ACETAMINOPHEN 5-325 MG PO TABS
1.0000 | ORAL_TABLET | ORAL | Status: DC | PRN
  Administered 2013-05-28 – 2013-05-30 (×8): 1 via ORAL
  Filled 2013-05-28 (×8): qty 1

## 2013-05-28 MED ORDER — DEXTROSE 5 % IV SOLN
2.0000 g | Freq: Four times a day (QID) | INTRAVENOUS | Status: AC
  Administered 2013-05-28 (×4): 2 g via INTRAVENOUS
  Filled 2013-05-28 (×4): qty 2

## 2013-05-28 MED ORDER — ZOLPIDEM TARTRATE 5 MG PO TABS: 5.0000 mg | ORAL_TABLET | Freq: Every evening | ORAL | Status: DC | PRN

## 2013-05-28 MED ORDER — ONDANSETRON HCL 4 MG/2ML IJ SOLN: 4.0000 mg | Freq: Three times a day (TID) | INTRAMUSCULAR | Status: DC | PRN

## 2013-05-28 MED ORDER — MEPERIDINE HCL 25 MG/ML IJ SOLN: 6.2500 mg | INTRAMUSCULAR | Status: DC | PRN

## 2013-05-28 MED ORDER — OXYTOCIN 40 UNITS IN LACTATED RINGERS INFUSION - SIMPLE MED: 62.5000 mL/h | INTRAVENOUS | Status: AC

## 2013-05-28 MED ORDER — SCOPOLAMINE 1 MG/3DAYS TD PT72
MEDICATED_PATCH | TRANSDERMAL | Status: AC
  Filled 2013-05-28: qty 1

## 2013-05-28 MED ORDER — FENTANYL CITRATE 0.05 MG/ML IJ SOLN: 25.0000 ug | INTRAMUSCULAR | Status: DC | PRN

## 2013-05-28 MED ORDER — DIPHENHYDRAMINE HCL 50 MG/ML IJ SOLN: 25.0000 mg | INTRAMUSCULAR | Status: DC | PRN

## 2013-05-28 MED ORDER — DIPHENHYDRAMINE HCL 25 MG PO CAPS
25.0000 mg | ORAL_CAPSULE | ORAL | Status: DC | PRN
  Filled 2013-05-28: qty 1

## 2013-05-28 MED ORDER — NALBUPHINE HCL 10 MG/ML IJ SOLN
5.0000 mg | INTRAMUSCULAR | Status: DC | PRN
  Administered 2013-05-28: 10 mg via INTRAVENOUS
  Filled 2013-05-28: qty 1

## 2013-05-28 MED ORDER — SENNOSIDES-DOCUSATE SODIUM 8.6-50 MG PO TABS
2.0000 | ORAL_TABLET | ORAL | Status: DC
Start: 2013-05-28 — End: 2013-05-30
  Administered 2013-05-29 – 2013-05-30 (×2): 2 via ORAL
  Filled 2013-05-28 (×2): qty 2

## 2013-05-28 MED ORDER — MENTHOL 3 MG MT LOZG: 1.0000 | LOZENGE | OROMUCOSAL | Status: DC | PRN

## 2013-05-28 MED ORDER — PRENATAL MULTIVITAMIN CH
1.0000 | ORAL_TABLET | Freq: Every day | ORAL | Status: DC
  Administered 2013-05-28 – 2013-05-29 (×2): 1 via ORAL
  Filled 2013-05-28 (×2): qty 1

## 2013-05-28 MED ORDER — SCOPOLAMINE 1 MG/3DAYS TD PT72
1.0000 | MEDICATED_PATCH | Freq: Once | TRANSDERMAL | Status: DC
  Administered 2013-05-28: 1.5 mg via TRANSDERMAL

## 2013-05-28 MED ORDER — NALBUPHINE HCL 10 MG/ML IJ SOLN: 5.0000 mg | INTRAMUSCULAR | Status: DC | PRN

## 2013-05-28 MED ORDER — ONDANSETRON HCL 4 MG PO TABS: 4.0000 mg | ORAL_TABLET | ORAL | Status: DC | PRN

## 2013-05-28 MED ORDER — DIPHENHYDRAMINE HCL 25 MG PO CAPS
25.0000 mg | ORAL_CAPSULE | Freq: Four times a day (QID) | ORAL | Status: DC | PRN
Start: 2013-05-28 — End: 2013-05-30
  Administered 2013-05-28: 25 mg via ORAL

## 2013-05-28 MED ORDER — NALOXONE HCL 0.4 MG/ML IJ SOLN: 0.4000 mg | INTRAMUSCULAR | Status: DC | PRN

## 2013-05-28 MED ORDER — KETOROLAC TROMETHAMINE 60 MG/2ML IM SOLN
INTRAMUSCULAR | Status: AC
  Filled 2013-05-28: qty 2

## 2013-05-28 MED ORDER — DIPHENHYDRAMINE HCL 50 MG/ML IJ SOLN: 12.5000 mg | INTRAMUSCULAR | Status: DC | PRN

## 2013-05-28 MED ORDER — SIMETHICONE 80 MG PO CHEW
80.0000 mg | CHEWABLE_TABLET | ORAL | Status: DC
Start: 2013-05-28 — End: 2013-05-30
  Administered 2013-05-29 – 2013-05-30 (×2): 80 mg via ORAL
  Filled 2013-05-28 (×2): qty 1

## 2013-05-28 MED ORDER — SODIUM CHLORIDE 0.9 % IJ SOLN: 3.0000 mL | INTRAMUSCULAR | Status: DC | PRN

## 2013-05-28 MED ORDER — SIMETHICONE 80 MG PO CHEW: 80.0000 mg | CHEWABLE_TABLET | ORAL | Status: DC | PRN

## 2013-05-28 MED ORDER — IBUPROFEN 600 MG PO TABS
600.0000 mg | ORAL_TABLET | Freq: Four times a day (QID) | ORAL | Status: DC
  Administered 2013-05-28 – 2013-05-30 (×9): 600 mg via ORAL
  Filled 2013-05-28 (×9): qty 1

## 2013-05-28 MED ORDER — KETOROLAC TROMETHAMINE 30 MG/ML IJ SOLN: 30.0000 mg | Freq: Four times a day (QID) | INTRAMUSCULAR | Status: AC | PRN

## 2013-05-28 MED ORDER — ACETAMINOPHEN 325 MG PO TABS
650.0000 mg | ORAL_TABLET | Freq: Once | ORAL | Status: AC
  Administered 2013-05-28: 650 mg via ORAL
  Filled 2013-05-28: qty 2

## 2013-05-28 MED ORDER — METOCLOPRAMIDE HCL 5 MG/ML IJ SOLN: 10.0000 mg | Freq: Three times a day (TID) | INTRAMUSCULAR | Status: DC | PRN

## 2013-05-28 MED ORDER — ACETAMINOPHEN 160 MG/5ML PO SOLN: 975.0000 mg | Freq: Four times a day (QID) | ORAL | Status: DC | PRN

## 2013-05-28 MED ORDER — SIMETHICONE 80 MG PO CHEW
80.0000 mg | CHEWABLE_TABLET | Freq: Three times a day (TID) | ORAL | Status: DC
Start: 2013-05-28 — End: 2013-05-30
  Administered 2013-05-28 – 2013-05-30 (×6): 80 mg via ORAL
  Filled 2013-05-28 (×6): qty 1

## 2013-05-28 MED ORDER — ONDANSETRON HCL 4 MG/2ML IJ SOLN: 4.0000 mg | INTRAMUSCULAR | Status: DC | PRN

## 2013-05-28 MED ORDER — KETOROLAC TROMETHAMINE 60 MG/2ML IM SOLN
60.0000 mg | Freq: Once | INTRAMUSCULAR | Status: AC | PRN
  Administered 2013-05-28: 60 mg via INTRAMUSCULAR

## 2013-05-28 MED ORDER — LACTATED RINGERS IV SOLN
INTRAVENOUS | Status: DC
  Administered 2013-05-28: 06:00:00 via INTRAVENOUS

## 2013-05-28 MED ORDER — DIBUCAINE 1 % RE OINT: 1.0000 "application " | TOPICAL_OINTMENT | RECTAL | Status: DC | PRN

## 2013-05-28 MED ORDER — WITCH HAZEL-GLYCERIN EX PADS: 1.0000 "application " | MEDICATED_PAD | CUTANEOUS | Status: DC | PRN

## 2013-05-28 MED ORDER — METOCLOPRAMIDE HCL 5 MG/ML IJ SOLN: 10.0000 mg | Freq: Once | INTRAMUSCULAR | Status: DC | PRN

## 2013-05-28 NOTE — Anesthesia Postprocedure Evaluation (Signed)
  Anesthesia Post-op Note  Anesthesia Post Note  Patient: Robin RainwaterShontria Chesson  Procedure(s) Performed: Procedure(s) (LRB): CESAREAN SECTION (N/A)  Anesthesia type: Epidural  Patient location: PACU  Post pain: Pain level controlled  Post assessment: Post-op Vital signs reviewed  Last Vitals:  Filed Vitals:   05/28/13 0215  BP: 106/71  Pulse: 102  Temp: 36.6 C  Resp: 20    Post vital signs: stable  Level of consciousness: awake  Complications: No apparent anesthesia complications

## 2013-05-28 NOTE — Addendum Note (Signed)
Addendum created 05/28/13 0802 by Cristela BlueKyle Chinaza Rooke, MD   Modules edited: Anesthesia Attestations, Anesthesia Blocks and Procedures, Anesthesia Medication Administration, Clinical Notes   Clinical Notes:  File: [id number]

## 2013-05-28 NOTE — Progress Notes (Signed)
Patient c/o O2 sat monitor beeping. O2 sats 92-95% Rm Air while awake, resting hovering around 90%. Suspect O2 sat dropping below 90% while asleep. Dr. Rodman Pickleassidy called and order received for 2L O2 via nasal cannula while patient is sleeping. Patient agreeable. Will continue to monitor patient.

## 2013-05-28 NOTE — Addendum Note (Signed)
Addendum created 05/28/13 0802 by Turner DanielsJennifer L Erlin Gardella, CRNA   Modules edited: Notes Section   Notes Section:  File: [id number]

## 2013-05-28 NOTE — Anesthesia Postprocedure Evaluation (Signed)
  Anesthesia Post-op Note  Anesthesia Post Note  Patient: Robin Berry  Procedure(s) Performed: Procedure(s) (LRB): CESAREAN SECTION (N/A)  Anesthesia type: Epidural  Patient location: Mother/Baby  Post pain: Pain level controlled  Post assessment: Post-op Vital signs reviewed  Last Vitals:  Filed Vitals:   05/28/13 0609  BP: 103/71  Pulse: 93  Temp:   Resp:     Post vital signs: Reviewed  Level of consciousness:alert  Complications: No apparent anesthesia complications

## 2013-05-28 NOTE — Progress Notes (Signed)
Patient ID: Robin Berry, female   DOB: 27-Jul-1990, 23 y.o.   MRN: [id number] POD # 1  Subjective: Pt reports feeling well, but sore. / Pain controlled with ibuprofen and has just taken first percocet Tolerating po/ Foley d/c'ed and has emptied bladder / No n/v/Flatus vague Activity: out of bed and ambulate Bleeding is light Newborn info:  Information for the patient's newborn:  Robin Berry, Girl Robin Berry [[id number]][[id number]]  female  Feeding: breast   Objective: VS: Blood pressure 122/77, pulse 87, temperature 98.2 F (36.8 C), temperature source Oral, resp. rate 20, height 5\' 1"  (1.549 m), weight 89.812 kg (198 lb), last menstrual period 09/01/2012, SpO2 97.00%, unknown if currently breastfeeding.    Intake/Output Summary (Last 24 hours) at 05/28/13 1058 Last data filed at 05/28/13 0700  Gross per 24 hour  Intake 2716.25 ml  Output   2000 ml  Net 716.25 ml      Recent Labs  05/27/13 0130 05/28/13 0540  WBC 12.1* 21.6*  HGB 11.8* 9.8*  HCT 35.0* 29.7*  PLT 210 170    Blood type: --/--/A POS, A POS (05/07 0130) Rubella: Immune (10/06 0000)    Physical Exam:  General: alert, cooperative and no distress CV: Regular rate and rhythm Resp: clear Abdomen: soft, nontender, normal bowel sounds Incision: Covered with Tegaderm and honeycomb dressing; well approximated. Uterine Fundus: firm, below umbilicus, nontender Lochia: minimal Ext: edema trace to +1 and Homans sign is negative, no sign of DVT    A/P: POD # 1 G1P1001 S/P C/Section d/t polyhydramnios, chorioamnionitis, and arrest of descent Afebrile since 0100, approx 12 hrs Doing well Continue routine post op orders   Signed: Demetrius RevelJulie K Schyler Butikofer, MSN, [facility]NP 05/28/2013, 10:58 AM

## 2013-05-28 NOTE — Progress Notes (Signed)
Patient received from PACU at 0113, temperature of 101. Dr. Juliene PinaMody called. Order received. Will continue to monitor patient.

## 2013-05-28 NOTE — Anesthesia Procedure Notes (Signed)
Epidural Patient location during procedure: OB Start time: 05/28/2013 8:07 AM  Preanesthetic Checklist Completed: patient identified, site marked, surgical consent, pre-op evaluation, timeout performed, IV checked, risks and benefits discussed and monitors and equipment checked  Epidural Patient position: sitting Prep: site prepped and draped and DuraPrep Patient monitoring: continuous pulse ox and blood pressure Approach: midline Injection technique: LOR air  Needle:  Needle type: Tuohy  Needle gauge: 17 G Needle length: 9 cm and 9 Needle insertion depth: 5 cm cm Catheter type: closed end flexible Catheter size: 19 Gauge Catheter at skin depth: 10 cm Test dose: negative  Assessment Events: blood not aspirated, injection not painful, no injection resistance, negative IV test and no paresthesia  Additional Notes Dosing of Epidural:  1st dose, through catheter ............................................Marland Kitchen. epi 1:200K + Xylocaine 40 mg  2nd dose, through catheter, after waiting 3 minutes...Marland Kitchen.Marland Kitchen.epi 1:200K + Xylocaine 60 mg    ( 2% Xylo charted as a single dose in Epic Meds for ease of charting; actual dosing was fractionated as above, for saftey's sake)  As each dose occurred, patient was free of IV sx; and patient exhibited no evidence of SA injection.  Patient is more comfortable after epidural dosed. Please see RN's note for documentation of vital signs,and FHR which are stable.  Patient reminded not to try to ambulate with numb legs, and that an RN must be present when she attempts to get up.

## 2013-05-29 LAB — CBC
HEMATOCRIT: 27.5 % — AB (ref 36.0–46.0)
HEMOGLOBIN: 9 g/dL — AB (ref 12.0–15.0)
MCH: 30.3 pg (ref 26.0–34.0)
MCHC: 32.7 g/dL (ref 30.0–36.0)
MCV: 92.6 fL (ref 78.0–100.0)
Platelets: 168 10*3/uL (ref 150–400)
RBC: 2.97 MIL/uL — ABNORMAL LOW (ref 3.87–5.11)
RDW: 13 % (ref 11.5–15.5)
WBC: 11 10*3/uL — ABNORMAL HIGH (ref 4.0–10.5)

## 2013-05-29 MED ORDER — POLYSACCHARIDE IRON COMPLEX 150 MG PO CAPS
150.0000 mg | ORAL_CAPSULE | Freq: Two times a day (BID) | ORAL | Status: DC
  Administered 2013-05-29 – 2013-05-30 (×2): 150 mg via ORAL
  Filled 2013-05-29 (×3): qty 1

## 2013-05-29 NOTE — Progress Notes (Addendum)
Patient ID: Robin Robin Berry Robin Berry, female   DOB: 11/21/1990, 23 y.o.   MRN: [id number] POD # 2  Subjective: Pt reports feeling well, but sore. / Pain controlled with ibuprofen and has just taken first percocet Tolerating po/ Foley d/c'ed and has emptied bladder / No n/v/ Flatus present, no BM Activity: out of bed and ambulate Bleeding is light Newborn info:  Information for the patient's newborn:  Robin Robin Berry, Robin Robin Berry [[id number]][[id number]]  female   Feeding: breast   Objective: VS: Blood pressure 92/60, pulse 73, temperature 97.8 F (36.6 C), temperature source Oral, resp. rate 18, height 5\' 1"  (1.549 m), weight 89.812 kg (198 lb), last menstrual period 09/01/2012, SpO2 98.00%, unknown if currently breastfeeding.    Intake/Output Summary (Last 24 hours) at 05/29/13 1020 Last data filed at 05/28/13 2004  Gross per 24 hour  Intake      0 ml  Output   1200 ml  Net  -1200 ml      Recent Labs  05/28/13 0540 05/29/13 0714  WBC 21.6* 11.0*  HGB 9.8* 9.0*  HCT 29.7* 27.5*  PLT 170 168    Blood type: A POS (05/07 0130) Rubella: Immune (10/06 0000)    Physical Exam:  General: alert, cooperative and no distress CV: Regular rate and rhythm Resp: clear Abdomen: soft, nontender, normal bowel sounds Incision: Covered with Tegaderm and honeycomb dressing; well approximated. Uterine Fundus: firm, below umbilicus, nontender Lochia: minimal Ext: edema trace to +1 and Homans sign is negative, no sign of DVT    A/P: POD # 2 / G1P1001 S/P C/Section d/t polyhydramnios, chorioamnionitis, and arrest of descent Anemia  Doing well  Continue routine post op orders Start Niferex 150 mg BID Shower today and remove pressure dressing Ambulate in the hallways Increase hydration Anticipate d/c home tomorrow  Signed: Kenard Gowerolitta M Laketha Leopard, MSN, CNM 05/29/2013, 10:20 AM

## 2013-05-30 MED ORDER — OXYCODONE-ACETAMINOPHEN 5-325 MG PO TABS
1.0000 | ORAL_TABLET | ORAL | Status: DC | PRN
Start: 2013-05-30 — End: 2020-07-31

## 2013-05-30 MED ORDER — IBUPROFEN 600 MG PO TABS: 600.0000 mg | ORAL_TABLET | Freq: Four times a day (QID) | ORAL | Status: DC

## 2013-05-30 NOTE — Discharge Summary (Signed)
POSTOPERATIVE DISCHARGE SUMMARY:  Patient ID: Steffanie RainwaterShontria Chesson MRN: [id number] DOB/AGE: 1990/12/05 23 y.o.  Admit date: 05/27/2013 Admission Diagnoses: Prodromal Labor, polyhydramnios    Discharge date:  05/30/2013 Discharge Diagnoses: S/P Primary C/S due to polyhydramnios, chorioamnionitis and arrest of dilation on 05/27/2013        Prenatal history: G1P1001   EDC : 05/30/2013, by ultrasound  [facility] 7.[redacted] wks gestation. Primary provider : Dr. Juliene PinaMody Prenatal course complicated by none  Prenatal Labs: ABO, Rh: --/--/A POS, A POS (05/07 0130)  Antibody: NEG (05/07 0130) Rubella: Immune (10/06 0000)  RPR: NON REAC (05/07 0130)  HBsAg: Negative (10/06 0000)  HIV: Non-reactive (10/06 0000)  GTT : 111 mg/dL GBS: Negative (40/9804/10 11910000)   Medical / Surgical History :  Past medical history:  Past Medical History  Diagnosis Date  . Anemia     Past surgical history:  Past Surgical History  Procedure Laterality Date  . Appendectomy    . Cholecystectomy    . Cesarean section N/A 05/27/2013    Procedure: CESAREAN SECTION;  Surgeon: Robley FriesVaishali R Mody, MD;  Location: [facility];  Service: Obstetrics;  Laterality: N/A;     Allergies: Lemon flavor   Intrapartum Course:  Admitted for polyhydramnios and prodromal labor for 3 days with several office and MAU visits for labor check. Office sonogram on 5/6 for AFI- was 27 cm, Vtx and hence decision was made for labor induction, cx was favorable at 2 cm. She presented earlier this morning for labor pain and was kept in house to proceed with labor augmentation. She received 2 doses of Stadol to rest and felt better. Pitocin was started / Development of chorioamnionitis and arrest of dilation proceeds a primary cesarean delivery of a viable female infant by Dr. Juliene PinaMody.    Physical Exam:   VSS: Blood pressure 114/69, pulse 81, temperature 98.2 F (36.8 C), temperature source Oral, resp. rate 18,  height 5\' 1"  (1.549 m), weight 89.812 kg (198 lb), last menstrual period 09/01/2012, SpO2 98.00%, unknown if currently breastfeeding.  LABS:  Recent Labs  05/28/13 0540 05/29/13 0714  WBC 21.6* 11.0*  HGB 9.8* 9.0*  PLT 170 168    Newborn Data Live born female on 05/27/2013 Birth Weight: 7 lb 14.3 oz (3581 g) APGAR: 8, 9  See operative report for further details  Home with mother.  Discharge Instructions:  Wound Care: keep clean and dry / remove honeycomb POD 7 Postpartum Instructions: [facility] discharge booklet - instructions reviewed Medications:    Medication List         acetaminophen 500 MG tablet  Commonly known as:  TYLENOL  Take 1,000 mg by mouth every 6 (six) hours as needed for mild pain or headache.     diphenhydrAMINE 25 MG tablet  Commonly known as:  BENADRYL  Take 50 mg by mouth at bedtime as needed for sleep.     ferrous sulfate 325 (65 FE) MG tablet  Take 325 mg by mouth daily.     ibuprofen 600 MG tablet  Commonly known as:  ADVIL,MOTRIN  Take 1 tablet (600 mg total) by mouth every 6 (six) hours.     oxyCODONE-acetaminophen 5-325 MG per tablet  Commonly known as:  PERCOCET/ROXICET  Take 1-2 tablets by mouth every 4 (four) hours as needed for severe pain (moderate - severe pain).     prenatal multivitamin Tabs tablet  Take 1 tablet by mouth at bedtime.  Follow-up Information   Follow up with MODY,VAISHALI R, MD. Schedule an appointment as soon as possible for a visit in 6 weeks. (For postpartum visit)    Specialty:  Obstetrics and Gynecology   Contact information:   [address] [place] [id number] [phone]         Signed: Kenard GowerRolitta M Yusuf Yu, MSN, CNM 05/30/2013, 8:17 AM

## 2013-05-30 NOTE — Discharge Instructions (Signed)
Breast Pumping Tips °Pumping your breast milk is a good way to stimulate milk production and have a steady supply of breast milk for your infant. Pumping is most helpful during your infant's growth spurts, when involving dad or a family member, or when you are away. There are several types of pumps available. They can be purchased at a baby or maternity store. You can begin pumping soon after delivery, but some experts believe that you should wait about four weeks to give your infant a bottle. °In general, the more you breastfeed or pump, the more milk you will have for your infant. It is also important to take good care of yourself. This will reduce stress and help your body to create a healthy supply of milk. Your caregiver or lactation consultant can give you the information and support you need in your efforts to breastfeed your infant. °PUMPING BREAST MILK  °Follow the tips below for successful breast pumping. °Take care of yourself. °· Drink enough water or fluids to keep urine clear or pale yellow. You may notice a thirsty feeling while breastfeeding. This is because your body needs more water to make breast milk. Keep a large water bottle handy. Make healthy drink choices such as unsweetened fruit juice, milk and water. Limit soda, coffee, and alcohol (wait 2 hours to feed or pump if you have an alcoholic drink.) °· Eat a healthy, well-balanced diet rich in fruits, vegetables, and whole grains. °· Exercise as recommended by your caregiver. °· Get plenty of sleep. Sleep when your infant sleeps. Ask friends and family for help if you need time to nap or rest. °· Do not smoke. Smoking can lower your milk supply and harm your infant. If you need help quitting, ask your caregiver for a program recommendation. °· Ask your caregiver about birth control options. Birth control pills may lower your milk supply. You may be advised to use condoms or other forms of birth control. °Relax and pump °Stimulating your  let-down reflex is the key to successful and effective pumping. This makes the milk in all parts of the breast flow more freely.  °· It is easier to pump breast milk (and breastfeed) while you are relaxed. Find techniques that work for you. Quiet private spaces, breast massage, soothing heat placed on the breast, music, and pictures or a tape recording of your infant may help you to relax and "let down" your milk. If you have difficulty with your let down, try smelling one of your infant's blankets or an item of clothing he or she has worn while you are pumping. °· When pumping, place the special suction cup (flange) directly over the nipple. It may be uncomfortable and cause nipple damage if it is not placed properly or is the wrong size. Applying a small amount of purified or modified lanolin to your nipple and the areola may help increase your comfort level. Also, you can change the speed and suction of many electric pumps to your comfort level. Your caregiver or lactation consultant can help you with this. °· If pumping continues to be painful, or you feel you are not getting very much milk when you pump, you may need a different type of pump. A lactation consultant can help you determine if this is the case. °· If you are with your infant, feed him or her on demand and try pumping after each feeding. This will boost your production, even if milk does not come out. You may not be able   to pump much milk at first, but keep up the routine, and this will change. °· If you are working or away from your infant for several hours, try pumping for about 15 minutes every 2 to 3 hours. Pump both breasts at the same time if you can. °· If your infant has a formula feeding, make sure you pump your milk around the same time to maintain your supply. °· Begin pumping breast milk a few weeks before you return to work. This will help you develop techniques that work for you and will be able to store extra milk. °· Find a source  of breastfeeding information that works well for you. °TIPS FOR STORING BREAST MILK °· Store breast milk in a sealable sterile bag, jar, or container provided with your pumping supplies. °· Store milk in small amounts close to what your infant is drinking at each feeding. °· Cool pumped milk in a refrigerator or cooler. Pumped milk can last at the back of the refrigerator for 3 to 8 days. °· Place cooled milk at the back of the freezer for up to 3 months. °· Thaw the milk in its container or bag in warm water up to 24 hours in advance. Do not use a microwave to thaw or heat milk. Do not refreeze the milk after it has been thawed. °· Breast milk is safe to drink when left at room temperature (mid 70s or colder) for 4 to 8 hours. After that, throw it away. °· Milk fat can separate and look funny. The color can vary slightly from day to day. This is normal. Always shake the milk before using it to mix the fat with the more watery portion. °SEEK MEDICAL CARE IF:  °· You are having trouble pumping or feeding your infant. °· You are concerned that you are not making enough milk. °· You have nipple pain, soreness, or redness. °· You have other questions or concerns related to you or your infant. °Document Released: 06/27/2009 Document Revised: 04/01/2011 Document Reviewed: 06/27/2009 °[employer]® Patient Information ©2014 [employer]. °Postpartum Depression and Baby Blues °The postpartum period begins right after the birth of a baby. During this time, there is often a great amount of joy and excitement. It is also a time of considerable changes in the life of the parent(s). Regardless of how many times a mother gives birth, each child brings new challenges and dynamics to the family. It is not unusual to have feelings of excitement accompanied by confusing shifts in moods, emotions, and thoughts. All mothers are at risk of developing postpartum depression or the "baby blues." These mood changes can occur right after giving  birth, or they may occur many months after giving birth. The baby blues or postpartum depression can be mild or severe. Additionally, postpartum depression can resolve rather quickly, or it can be a long-term condition. °CAUSES °Elevated hormones and their rapid decline are thought to be a main cause of postpartum depression and the baby blues. There are a number of hormones that radically change during and after pregnancy. Estrogen and progesterone usually decrease immediately after delivering your baby. The level of thyroid hormone and various cortisol steroids also rapidly drop. Other factors that play a major role in these changes include major life events and genetics.  °RISK FACTORS °If you have any of the following risks for the baby blues or postpartum depression, know what symptoms to watch out for during the postpartum period. Risk factors that may increase the likelihood of   getting the baby blues or postpartum depression include: °· Having a personal or family history of depression. °· Having depression while being pregnant. °· Having premenstrual or oral contraceptive-associated mood issues. °· Having exceptional life stress. °· Having marital conflict. °· Lacking a social support network. °· Having a baby with special needs. °· Having health problems such as diabetes. °SYMPTOMS °Baby blues symptoms include: °· Brief fluctuations in mood, such as going from extreme happiness to sadness. °· Decreased concentration. °· Difficulty sleeping. °· Crying spells, tearfulness. °· Irritability. °· Anxiety. °Postpartum depression symptoms typically begin within the first month after giving birth. These symptoms include: °· Difficulty sleeping or excessive sleepiness. °· Marked weight loss. °· Agitation. °· Feelings of worthlessness. °· Lack of interest in activity or food. °Postpartum psychosis is a very concerning condition and can be dangerous. Fortunately, it is rare. Displaying any of the following symptoms is  cause for immediate medical attention. Postpartum psychosis symptoms include: °· Hallucinations and delusions. °· Bizarre or disorganized behavior. °· Confusion or disorientation. °DIAGNOSIS  °A diagnosis is made by an evaluation of your symptoms. There are no medical or lab tests that lead to a diagnosis, but there are various questionnaires that a caregiver may use to identify those with the baby blues, postpartum depression, or psychosis. Often times, a screening tool called the Edinburgh Postnatal Depression Scale is used to diagnose depression in the postpartum period.  °TREATMENT °The baby blues usually goes away on its own in 1 to 2 weeks. Social support is often all that is needed. You should be encouraged to get adequate sleep and rest. Occasionally, you may be given medicines to help you sleep.  °Postpartum depression requires treatment as it can last several months or longer if it is not treated. Treatment may include individual or group therapy, medicine, or both to address any social, physiological, and psychological factors that may play a role in the depression. Regular exercise, a healthy diet, rest, and social support may also be strongly recommended.  °Postpartum psychosis is more serious and needs treatment right away. Hospitalization is often needed. °HOME CARE INSTRUCTIONS °· Get as much rest as you can. Nap when the baby sleeps. °· Exercise regularly. Some women find yoga and walking to be beneficial. °· Eat a balanced and nourishing diet. °· Do little things that you enjoy. Have a cup of tea, take a bubble bath, read your favorite magazine, or listen to your favorite music. °· Avoid alcohol. °· Ask for help with household chores, cooking, grocery shopping, or running errands as needed. Do not try to do everything. °· Talk to people close to you about how you are feeling. Get support from your partner, family members, friends, or other new moms. °· Try to stay positive in how you think. Think  about the things you are grateful for. °· Do not spend a lot of time alone. °· Only take medicine as directed by your caregiver. °· Keep all your postpartum appointments. °· Let your caregiver know if you have any concerns. °SEEK MEDICAL CARE IF: °You are having a reaction or problems with your medicine. °SEEK IMMEDIATE MEDICAL CARE IF: °· You have suicidal feelings. °· You feel you may harm the baby or someone else. °Document Released: 10/12/2003 Document Revised: 04/01/2011 Document Reviewed: 10/19/2012 °[employer]® Patient Information ©2014 [employer]. °Breastfeeding and Mastitis °Mastitis is inflammation of the breast tissue. It can occur in women who are breastfeeding. This can make breastfeeding painful. Mastitis will sometimes go away on its own. Your   health care provider will help determine if treatment is needed. °CAUSES °Mastitis is often associated with a blocked milk (lactiferous) duct. This can happen when too much milk builds up in the breast. Causes of excess milk in the breast can include: °· Poor latch-on. If your baby is not latched onto the breast properly, she or he may not empty your breast completely while breastfeeding. °· Allowing too much time to pass between feedings. °· Wearing a bra or other clothing that is too tight. This puts extra pressure on the lactiferous ducts so milk does not flow through them as it should. °Mastitis can also be caused by a bacterial infection. Bacteria may enter the breast tissue through cuts or openings in the skin. In women who are breastfeeding, this may occur because of cracked or irritated skin. Cracks in the skin are often caused when your baby does not latch on properly to the breast. °SIGNS AND SYMPTOMS °· Swelling, redness, tenderness, and pain in an area of the breast. °· Swelling of the glands under the arm on the same side. °· Fever may or may not accompany mastitis. °If an infection is allowed to progress, a collection of pus (abscess) may  develop. °DIAGNOSIS  °Your health care provider can usually diagnose mastitis based on your symptoms and a physical exam. Tests may be done to help confirm the diagnosis. These may include: °· Removal of pus from the breast by applying pressure to the area. This pus can be examined in the lab to determine which bacteria are present. If an abscess has developed, the fluid in the abscess can be removed with a needle. This can also be used to confirm the diagnosis and determine the bacteria present. In most cases, pus will not be present. °· Blood tests to determine if your body is fighting a bacterial infection. °· Mammogram or ultrasound tests to rule out other problems or diseases. °TREATMENT  °Mastitis that occurs with breastfeeding will sometimes go away on its own. Your health care provider may choose to wait 24 hours after first seeing you to decide whether a prescription medicine is needed. If your symptoms are worse after 24 hours, your health care provider will likely prescribe an antibiotic to treat the mastitis. He or she will determine which bacteria are most likely causing the infection and will then select an appropriate antibiotic. This is sometimes changed based on the results of tests performed to identify the bacteria, or if there is no response to the antibiotic selected. Antibiotics are usually given by mouth. You may also be given medicine for pain. °HOME CARE INSTRUCTIONS °· Only take over-the-counter or prescription medicines for pain, fever, or discomfort as directed by your health care provider. °· If your health care provider prescribed an antibiotic, take the medicine as directed. Make sure you finish it even if you start to feel better. °· Do not wear a tight or underwire bra. Wear a soft, supportive bra. °· Increase your fluid intake, especially if you have a fever. °· Continue to empty the breast. Your health care provider can tell you whether this milk is safe for your infant or needs to  be thrown out. You may be told to stop nursing until your health care provider thinks it is safe for your baby. Use a breast pump if you are advised to stop nursing. °· Keep your nipples clean and dry. °· Empty the first breast completely before going to the other breast. If your baby is not emptying   your breasts completely for some reason, use a breast pump to empty your breasts. °· If you go back to work, pump your breasts while at work to stay in time with your nursing schedule. °· Avoid allowing your breasts to become overly filled with milk (engorged). °SEEK MEDICAL CARE IF: °· You have pus-like discharge from the breast. °· Your symptoms do not improve with the treatment prescribed by your health care provider within 2 days. °SEEK IMMEDIATE MEDICAL CARE IF: °· Your pain and swelling are getting worse. °· You have pain that is not controlled with medicine. °· You have a red line extending from the breast toward your armpit. °· You have a fever or persistent symptoms for more than 2 3 days. °· You have a fever and your symptoms suddenly get worse. °MAKE SURE YOU:  °· Understand these instructions. °· Will watch your condition. °· Will get help right away if you are not doing well or get worse. °Document Released: 05/04/2004 Document Revised: 09/09/2012 Document Reviewed: 08/13/2012 °[employer]® Patient Information ©2014 [employer]. °Breastfeeding °Deciding to breastfeed is one of the best choices you can make for you and your baby. A change in hormones during pregnancy causes your breast tissue to grow and increases the number and size of your milk ducts. These hormones also allow proteins, sugars, and fats from your blood supply to make breast milk in your milk-producing glands. Hormones prevent breast milk from being released before your baby is born as well as prompt milk flow after birth. Once breastfeeding has begun, thoughts of your baby, as well as his or her sucking or crying, can stimulate the release  of milk from your milk-producing glands.  °BENEFITS OF BREASTFEEDING °For Your Baby °· Your first milk (colostrum) helps your baby's digestive system function better.   °· There are antibodies in your milk that help your baby fight off infections.   °· Your baby has a lower incidence of asthma, allergies, and sudden infant death syndrome.   °· The nutrients in breast milk are better for your baby than infant formulas and are designed uniquely for your baby's needs.   °· Breast milk improves your baby's brain development.   °· Your baby is less likely to develop other conditions, such as childhood obesity, asthma, or type 2 diabetes mellitus.   °For You  °· Breastfeeding helps to create a very special bond between you and your baby.   °· Breastfeeding is convenient. Breast milk is always available at the correct temperature and costs nothing.   °· Breastfeeding helps to burn calories and helps you lose the weight gained during pregnancy.   °· Breastfeeding makes your uterus contract to its prepregnancy size faster and slows bleeding (lochia) after you give birth.   °· Breastfeeding helps to lower your risk of developing type 2 diabetes mellitus, osteoporosis, and breast or ovarian cancer later in life. °SIGNS THAT YOUR BABY IS HUNGRY °Early Signs of Hunger  °· Increased alertness or activity. °· Stretching. °· Movement of the head from side to side. °· Movement of the head and opening of the mouth when the corner of the mouth or cheek is stroked (rooting). °· Increased sucking sounds, smacking lips, cooing, sighing, or squeaking. °· Hand-to-mouth movements. °· Increased sucking of fingers or hands. °Late Signs of Hunger °· Fussing. °· Intermittent crying. °Extreme Signs of Hunger °Signs of extreme hunger will require calming and consoling before your baby will be able to breastfeed successfully. Do not wait for the following signs of extreme hunger to occur before you initiate breastfeeding:   °·   Restlessness. °· A  loud, strong cry. °·  Screaming. °BREASTFEEDING BASICS °Breastfeeding Initiation °· Find a comfortable place to sit or lie down, with your neck and back well supported. °· Place a pillow or rolled up blanket under your baby to bring him or her to the level of your breast (if you are seated). Nursing pillows are specially designed to help support your arms and your baby while you breastfeed. °· Make sure that your baby's abdomen is facing your abdomen.   °· Gently massage your breast. With your fingertips, massage from your chest wall toward your nipple in a circular motion. This encourages milk flow. You may need to continue this action during the feeding if your milk flows slowly. °· Support your breast with 4 fingers underneath and your thumb above your nipple. Make sure your fingers are well away from your nipple and your baby's mouth.   °· Stroke your baby's lips gently with your finger or nipple.   °· When your baby's mouth is open wide enough, quickly bring your baby to your breast, placing your entire nipple and as much of the colored area around your nipple (areola) as possible into your baby's mouth.   °· More areola should be visible above your baby's upper lip than below the lower lip.   °· Your baby's tongue should be between his or her lower gum and your breast.   °· Ensure that your baby's mouth is correctly positioned around your nipple (latched). Your baby's lips should create a seal on your breast and be turned out (everted). °· It is common for your baby to suck about 2 3 minutes in order to start the flow of breast milk. °Latching °Teaching your baby how to latch on to your breast properly is very important. An improper latch can cause nipple pain and decreased milk supply for you and poor weight gain in your baby. Also, if your baby is not latched onto your nipple properly, he or she may swallow some air during feeding. This can make your baby fussy. Burping your baby when you switch breasts  during the feeding can help to get rid of the air. However, teaching your baby to latch on properly is still the best way to prevent fussiness from swallowing air while breastfeeding. °Signs that your baby has successfully latched on to your nipple:    °· Silent tugging or silent sucking, without causing you pain.   °· Swallowing heard between every 3 4 sucks.   °·  Muscle movement above and in front of his or her ears while sucking.   °Signs that your baby has not successfully latched on to nipple:  °· Sucking sounds or smacking sounds from your baby while breastfeeding. °· Nipple pain. °If you think your baby has not latched on correctly, slip your finger into the corner of your baby's mouth to break the suction and place it between your baby's gums. Attempt breastfeeding initiation again. °Signs of Successful Breastfeeding °Signs from your baby:   °· A gradual decrease in the number of sucks or complete cessation of sucking.   °· Falling asleep.   °· Relaxation of his or her body.   °· Retention of a small amount of milk in his or her mouth.   °· Letting go of your breast by himself or herself. °Signs from you: °· Breasts that have increased in firmness, weight, and size 1 3 hours after feeding.   °· Breasts that are softer immediately after breastfeeding. °· Increased milk volume, as well as a change in milk consistency and color by the   5th day of breastfeeding.   °· Nipples that are not sore, cracked, or bleeding. °Signs That Your Baby is Getting Enough Milk °· Wetting at least 3 diapers in a 24-hour period. The urine should be clear and pale yellow by age 5 days. °· At least 3 stools in a 24-hour period by age 5 days. The stool should be soft and yellow. °· At least 3 stools in a 24-hour period by age 7 days. The stool should be seedy and yellow. °· No loss of weight greater than 10% of birth weight during the first 3 days of age. °· Average weight gain of 4 7 ounces (120 210 mL) per week after age 4  days. °· Consistent daily weight gain by age 5 days, without weight loss after the age of 2 weeks. °After a feeding, your baby may spit up a small amount. This is common. °BREASTFEEDING FREQUENCY AND DURATION °Frequent feeding will help you make more milk and can prevent sore nipples and breast engorgement. Breastfeed when you feel the need to reduce the fullness of your breasts or when your baby shows signs of hunger. This is called "breastfeeding on demand." Avoid introducing a pacifier to your baby while you are working to establish breastfeeding (the first 4 6 weeks after your baby is born). After this time you may choose to use a pacifier. Research has shown that pacifier use during the first year of a baby's life decreases the risk of sudden infant death syndrome (SIDS). °Allow your baby to feed on each breast as long as he or she wants. Breastfeed until your baby is finished feeding. When your baby unlatches or falls asleep while feeding from the first breast, offer the second breast. Because newborns are often sleepy in the first few weeks of life, you may need to awaken your baby to get him or her to feed. °Breastfeeding times will vary from baby to baby. However, the following rules can serve as a guide to help you ensure that your baby is properly fed: °· Newborns (babies 4 weeks of age or younger) may breastfeed every 1 3 hours. °· Newborns should not go longer than 3 hours during the day or 5 hours during the night without breastfeeding. °· You should breastfeed your baby a minimum of 8 times in a 24-hour period until you begin to introduce solid foods to your baby at around 6 months of age. °BREAST MILK PUMPING °Pumping and storing breast milk allows you to ensure that your baby is exclusively fed your breast milk, even at times when you are unable to breastfeed. This is especially important if you are going back to work while you are still breastfeeding or when you are not able to be present during  feedings. Your lactation consultant can give you guidelines on how long it is safe to store breast milk.  °A breast pump is a machine that allows you to pump milk from your breast into a sterile bottle. The pumped breast milk can then be stored in a refrigerator or freezer. Some breast pumps are operated by hand, while others use electricity. Ask your lactation consultant which type will work best for you. Breast pumps can be purchased, but some hospitals and breastfeeding support groups lease breast pumps on a monthly basis. A lactation consultant can teach you how to hand express breast milk, if you prefer not to use a pump.  °CARING FOR YOUR BREASTS WHILE YOU BREASTFEED °Nipples can become dry, cracked, and sore while breastfeeding.   The following recommendations can help keep your breasts moisturized and healthy: °· Avoid using soap on your nipples.   °· Wear a supportive bra. Although not required, special nursing bras and tank tops are designed to allow access to your breasts for breastfeeding without taking off your entire bra or top. Avoid wearing underwire style bras or extremely tight bras. °· Air dry your nipples for 3 4 minutes after each feeding.   °· Use only cotton bra pads to absorb leaked breast milk. Leaking of breast milk between feedings is normal.   °· Use lanolin on your nipples after breastfeeding. Lanolin helps to maintain your skin's normal moisture barrier. If you use pure lanolin you do not need to wash it off before feeding your baby again. Pure lanolin is not toxic to your baby. You may also hand express a few drops of breast milk and gently massage that milk into your nipples and allow the milk to air dry. °In the first few weeks after giving birth, some women experience extremely full breasts (engorgement). Engorgement can make your breasts feel heavy, warm, and tender to the touch. Engorgement peaks within 3 5 days after you give birth. The following recommendations can help ease  engorgement: °· Completely empty your breasts while breastfeeding or pumping. You may want to start by applying warm, moist heat (in the shower or with warm water-soaked hand towels) just before feeding or pumping. This increases circulation and helps the milk flow. If your baby does not completely empty your breasts while breastfeeding, pump any extra milk after he or she is finished. °· Wear a snug bra (nursing or regular) or tank top for 1 2 days to signal your body to slightly decrease milk production. °· Apply ice packs to your breasts, unless this is too uncomfortable for you. °· Make sure that your baby is latched on and positioned properly while breastfeeding. °If engorgement persists after 48 hours of following these recommendations, contact your health care provider or a lactation consultant. °OVERALL HEALTH CARE RECOMMENDATIONS WHILE BREASTFEEDING °· Eat healthy foods. Alternate between meals and snacks, eating 3 of each per day. Because what you eat affects your breast milk, some of the foods may make your baby more irritable than usual. Avoid eating these foods if you are sure that they are negatively affecting your baby. °· Drink milk, fruit juice, and water to satisfy your thirst (about 10 glasses a day).   °· Rest often, relax, and continue to take your prenatal vitamins to prevent fatigue, stress, and anemia. °· Continue breast self-awareness checks. °· Avoid chewing and smoking tobacco. °· Avoid alcohol and drug use. °Some medicines that may be harmful to your baby can pass through breast milk. It is important to ask your health care provider before taking any medicine, including all over-the-counter and prescription medicine as well as vitamin and herbal supplements. °It is possible to become pregnant while breastfeeding. If birth control is desired, ask your health care provider about options that will be safe for your baby. °SEEK MEDICAL CARE IF:  °· You feel like you want to stop breastfeeding  or have become frustrated with breastfeeding. °· You have painful breasts or nipples. °· Your nipples are cracked or bleeding. °· Your breasts are red, tender, or warm. °· You have a swollen area on either breast. °· You have a fever or chills. °· You have nausea or vomiting. °· You have drainage other than breast milk from your nipples. °· Your breasts do not become full before feedings by   the 5th day after you give birth. °· You feel sad and depressed. °· Your baby is too sleepy to eat well. °· Your baby is having trouble sleeping.   °· Your baby is wetting less than 3 diapers in a 24-hour period. °· Your baby has less than 3 stools in a 24-hour period. °· Your baby's skin or the white part of his or her eyes becomes yellow.   °· Your baby is not gaining weight by 5 days of age. °SEEK IMMEDIATE MEDICAL CARE IF:  °· Your baby is overly tired (lethargic) and does not want to wake up and feed. °· Your baby develops an unexplained fever. °Document Released: 01/07/2005 Document Revised: 09/09/2012 Document Reviewed: 07/01/2012 °Exit[employer]® Patient Information ©2014 [employer]. ° °

## 2013-05-30 NOTE — Progress Notes (Signed)
Patient ID: Robin Berry, female   DOB: October 22, 1990, 23 y.o.   MRN: [id number] POD # 3  Subjective: Pt reports feeling well, but sore. / Pain controlled with ibuprofen and has just taken first percocet Tolerating po/ Foley d/c'ed and has emptied bladder / No n/v/ Flatus present, no BM Activity: out of bed and ambulate Bleeding is light Newborn info:  Information for the patient's newborn:  Robin Berry, Robin Berry [[id number]][[id number]]  female   Feeding: breast   Objective: VS: Blood pressure 114/69, pulse 81, temperature 98.2 F (36.8 C), temperature source Oral, resp. rate 18, height 5\' 1"  (1.549 m), weight 89.812 kg (198 lb), last menstrual period 09/01/2012, SpO2 98.00%, unknown if currently breastfeeding.   No intake or output data in the 24 hours ending 05/30/13 0812    Recent Labs  05/28/13 0540 05/29/13 0714  WBC 21.6* 11.0*  HGB 9.8* 9.0*  HCT 29.7* 27.5*  PLT 170 168    Blood type: A POS (05/07 0130) Rubella: Immune (10/06 0000)    Physical Exam:  General: alert, cooperative and no distress CV: Regular rate and rhythm Resp: clear Abdomen: soft, nontender, normal bowel sounds Incision: Covered with Tegaderm and honeycomb dressing; well approximated. Uterine Fundus: firm, below umbilicus, nontender Lochia: minimal Ext: edema trace to +1 and Homans sign is negative, no sign of DVT    A/P: POD # 3 / G1P1001 S/P C/Section d/t polyhydramnios, chorioamnionitis, and arrest of descent Anemia  Doing well  Routine post op orders Continue Niferex 150 mg BID Remove remaining pressure dressing today in shower Increase hydration D/C Home today  Signed: Kenard Gowerolitta M Aireanna Luellen, MSN, CNM 05/30/2013, 8:12 AM

## 2013-06-30 NOTE — Addendum Note (Signed)
Addendum created 06/30/13 1523 by Shanon Payor, CRNA   Modules edited: Anesthesia Medication Administration

## 2013-11-22 ENCOUNTER — Encounter (HOSPITAL_COMMUNITY): Payer: Self-pay

## 2015-01-21 IMAGING — US US ABDOMEN COMPLETE
1 series · 14 of 25 positions shown · non-contrast
Comparison: None available

CLINICAL DATA: Right upper abdominal pain, pregnant.

EXAM:
COMPLETE ABDOMINAL ULTRASOUND

[Series 1: us abdomen complete · 14 of 79 slices shown]
[im 1/79]
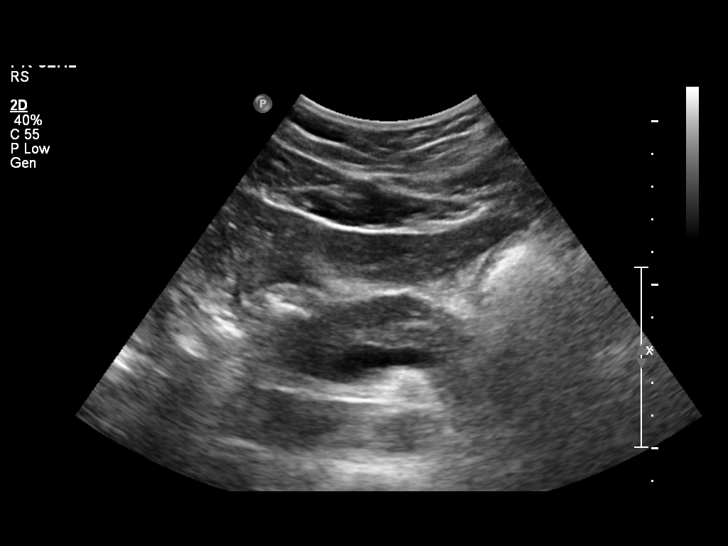
[im 7/79]
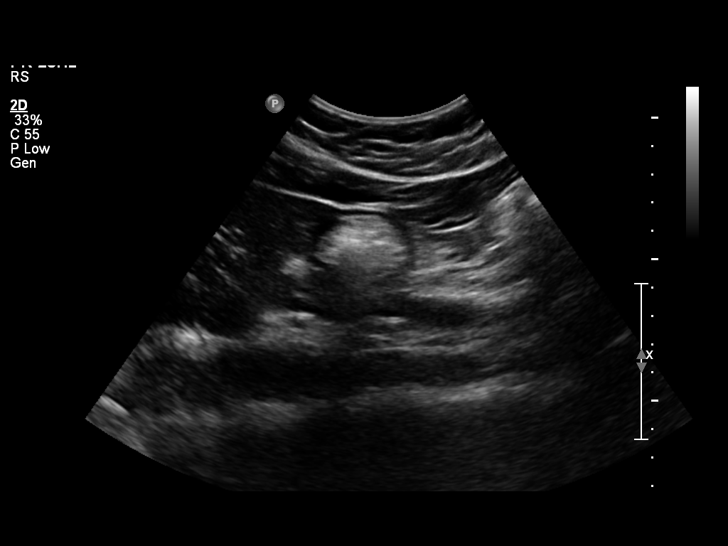
[im 14/79]
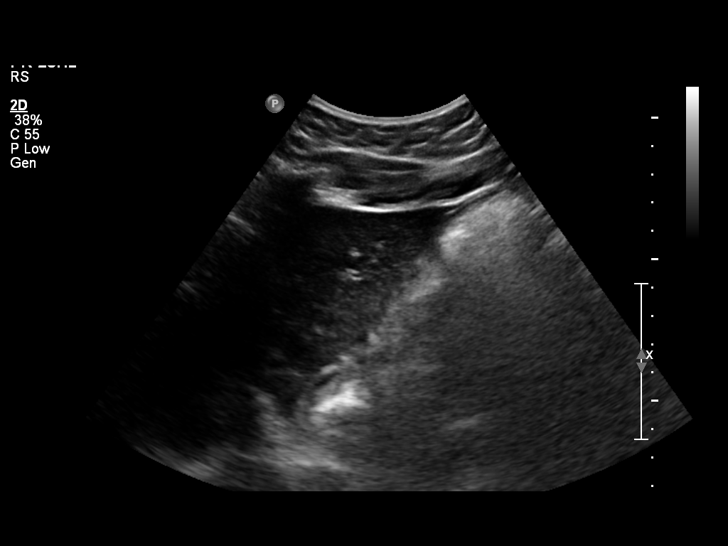
[im 20/79]
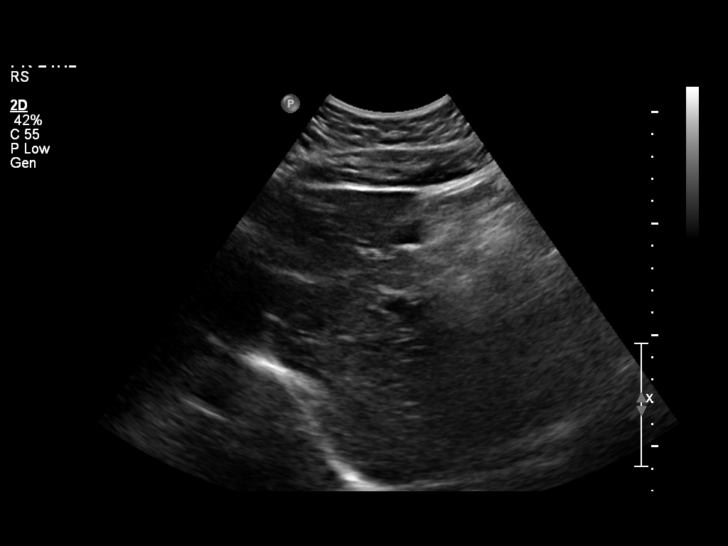
[im 27/79]
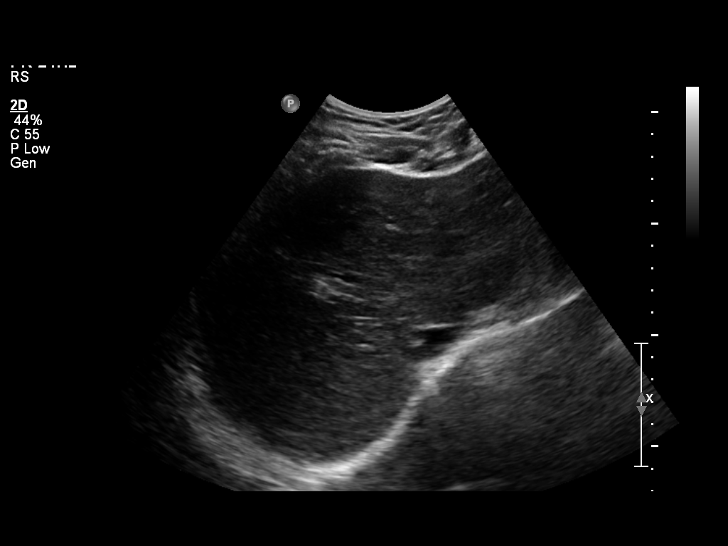
[im 30/79]
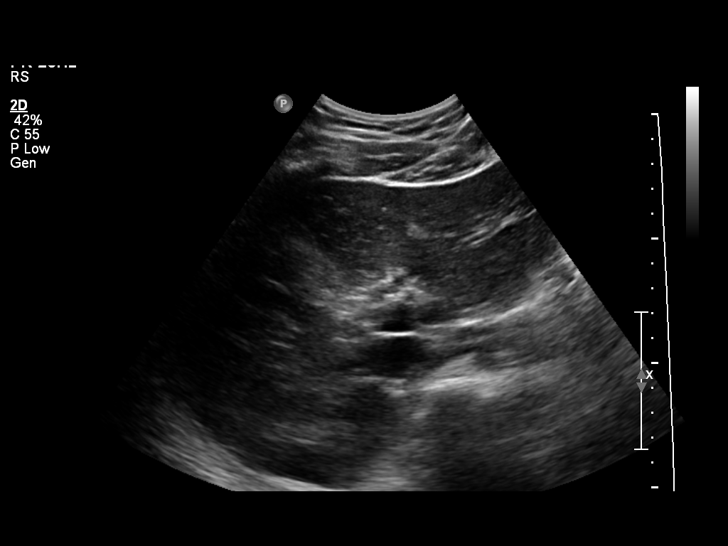
[im 36/79]
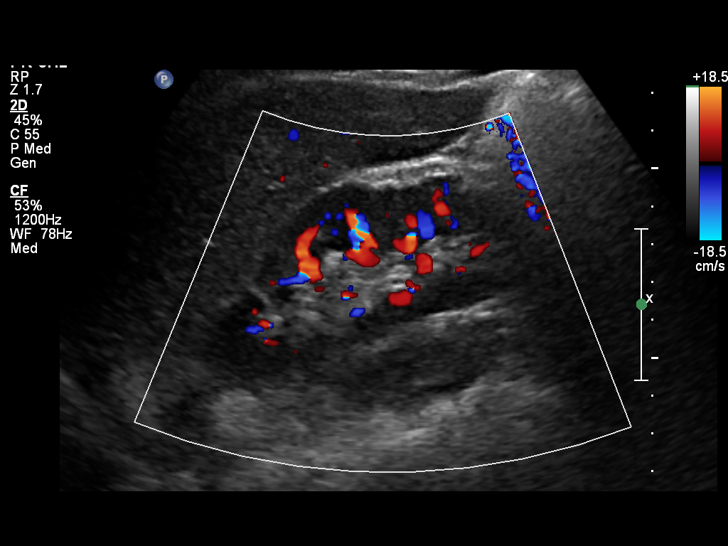
[im 43/79]
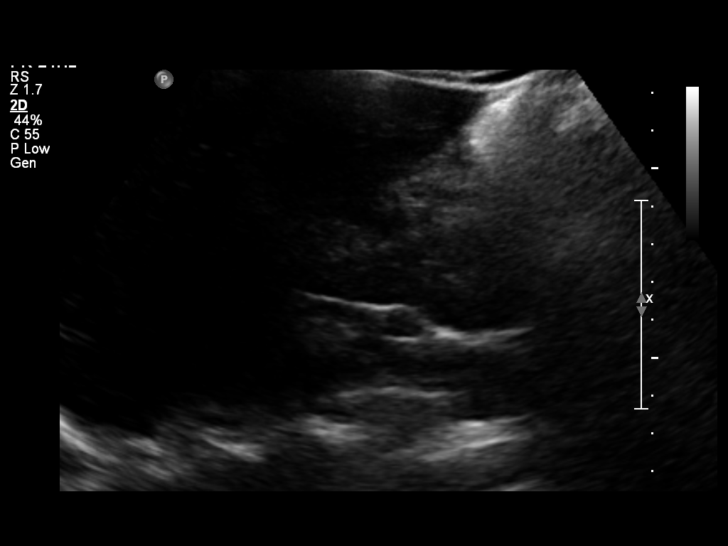
[im 49/79]
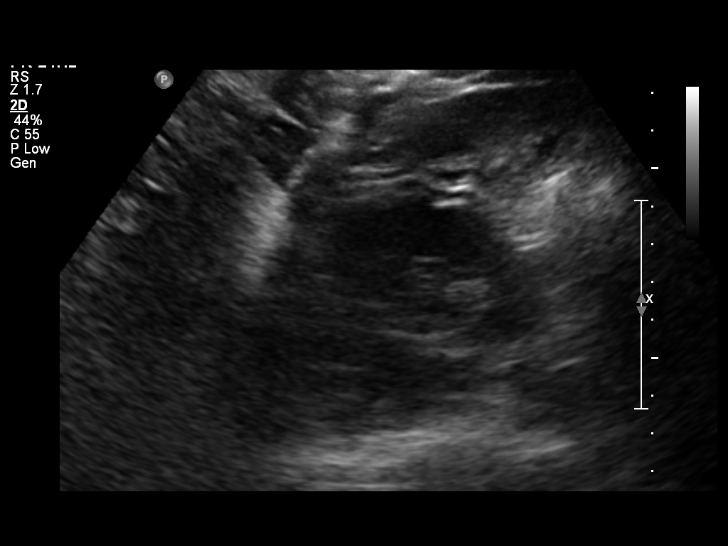
[im 53/79]
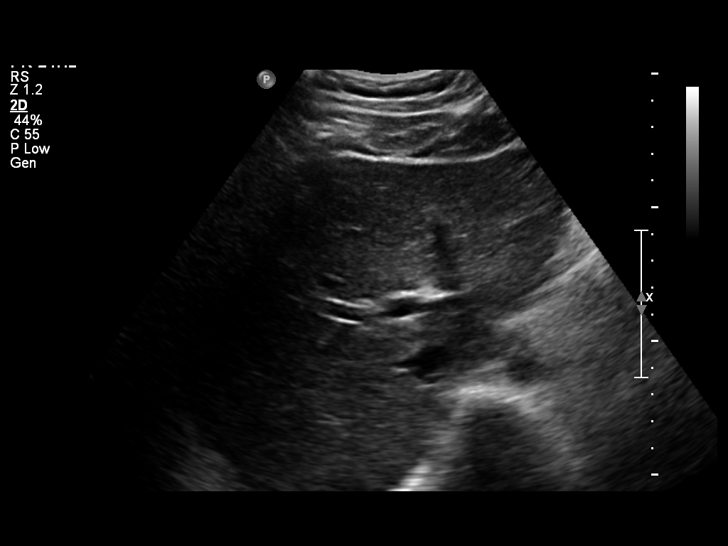
[im 59/79]
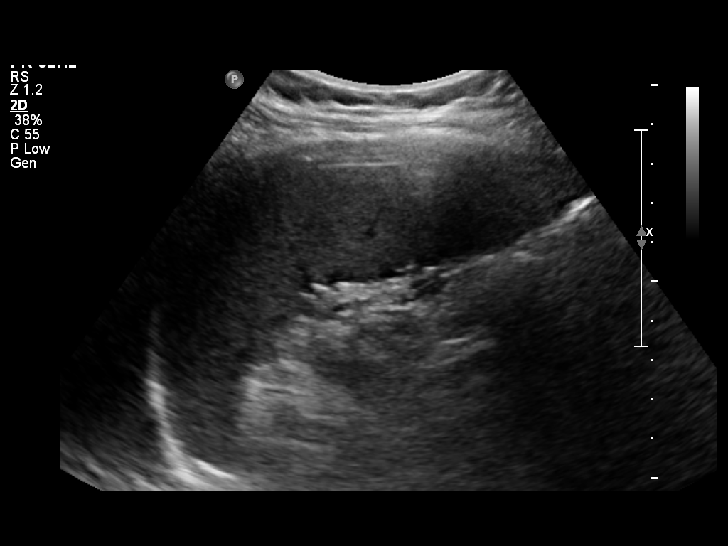
[im 66/79]
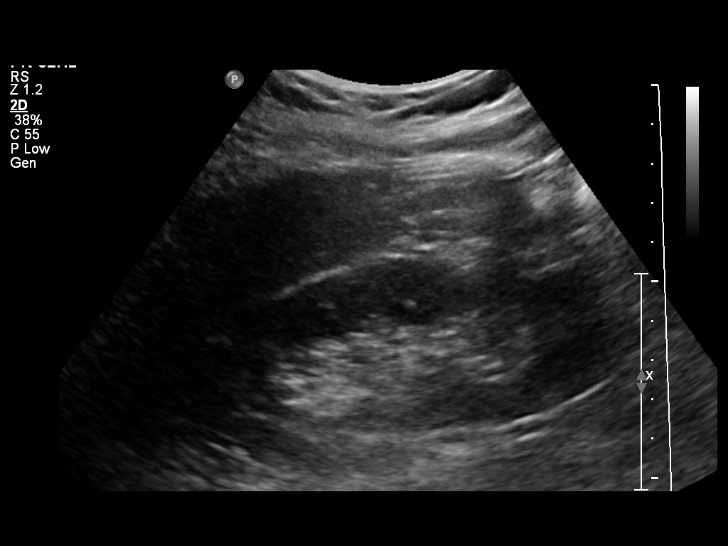
[im 72/79]
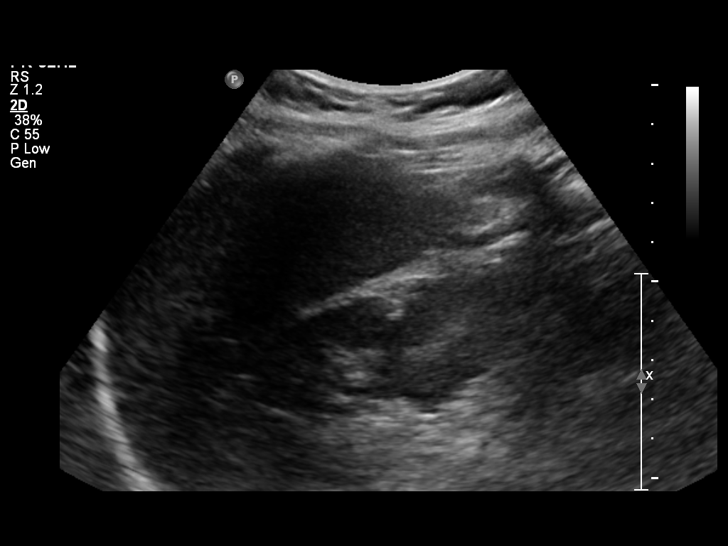
[im 79/79]
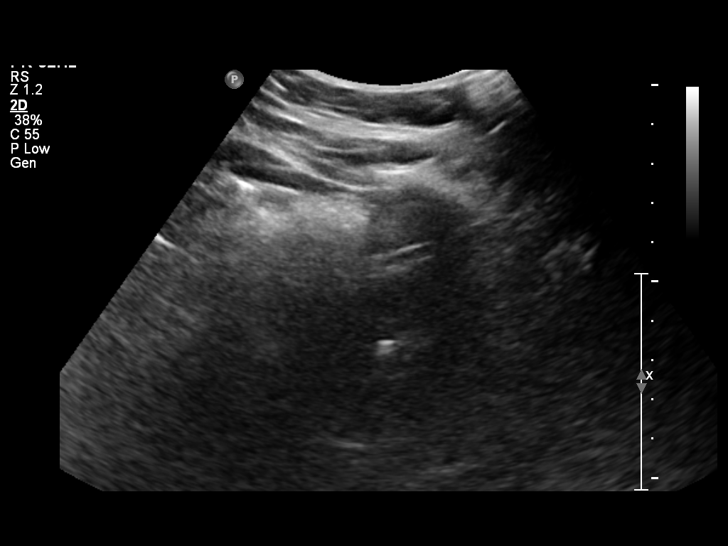

[14 of 25 positions shown; findings below may reference images not displayed]

FINDINGS: Gallbladder:  Surgically absent

Common bile duct:  Normal in caliber, 1.9mm diameter.

Liver: Homogeneous in echotexture without focal lesion or
intrahepatic bile duct dilatation.

IVC:  Negative

Pancreas:  Negative

Spleen:  No focal lesion, 10.3cm in length.

Right Kidney:  No mass or hydronephrosis, 10.0cm in length.

Left Kidney:  No lesion or hydronephrosis, 9.7cm in length.

Abdominal aorta:  Negative
IMPRESSION: Negative, post cholecystectomy.

## 2020-01-28 LAB — OB RESULTS CONSOLE HIV ANTIBODY (ROUTINE TESTING): HIV: NONREACTIVE

## 2020-01-28 LAB — OB RESULTS CONSOLE HEPATITIS B SURFACE ANTIGEN: Hepatitis B Surface Ag: NEGATIVE

## 2020-01-28 LAB — OB RESULTS CONSOLE GC/CHLAMYDIA
Chlamydia: NEGATIVE
Gonorrhea: NEGATIVE

## 2020-01-28 LAB — OB RESULTS CONSOLE VARICELLA ZOSTER ANTIBODY, IGG: Varicella: IMMUNE

## 2020-01-28 LAB — OB RESULTS CONSOLE RUBELLA ANTIBODY, IGM: Rubella: IMMUNE

## 2020-01-28 LAB — OB RESULTS CONSOLE RPR: RPR: NONREACTIVE

## 2020-07-05 ENCOUNTER — Other Ambulatory Visit: Payer: Self-pay | Admitting: Obstetrics & Gynecology

## 2020-07-17 LAB — OB RESULTS CONSOLE GBS: GBS: NEGATIVE

## 2020-07-28 ENCOUNTER — Encounter (HOSPITAL_COMMUNITY): Payer: Self-pay | Admitting: *Deleted

## 2020-07-28 NOTE — Patient Instructions (Signed)
Robin Berry  07/28/2020   Your procedure is scheduled on:  08/11/2020  Arrive at 1130 at Entrance C on [address] at [facility]  and [employer]. You are invited to use the FREE valet parking or use the Visitor's parking deck.  Pick up the phone at the desk and dial [phone].  Call this number if you have problems the morning of surgery: [phone]  Remember:   Do not eat food:(After Midnight) Desps de medianoche.  Do not drink clear liquids: (After Midnight) Desps de medianoche.  Take these medicines the morning of surgery with A SIP OF WATER:  none   Do not wear jewelry, make-up or nail polish.  Do not wear lotions, powders, or perfumes. Do not wear deodorant.  Do not shave 48 hours prior to surgery.  Do not bring valuables to the hospital.  [facility] is not   responsible for any belongings or valuables brought to the hospital.  Contacts, dentures or bridgework may not be worn into surgery.  Leave suitcase in the car. After surgery it may be brought to your room.  For patients admitted to the hospital, checkout time is 11:00 AM the day of              discharge.      Please read over the following fact sheets that you were given:     Preparing for Surgery

## 2020-07-29 ENCOUNTER — Inpatient Hospital Stay (HOSPITAL_COMMUNITY): Payer: BC Managed Care – PPO | Admitting: Anesthesiology

## 2020-07-29 ENCOUNTER — Encounter (HOSPITAL_COMMUNITY)
Admission: AD | Disposition: A | Discharge status: Home / Self Care | Payer: Self-pay | Source: Home / Self Care | Attending: Obstetrics & Gynecology

## 2020-07-29 ENCOUNTER — Other Ambulatory Visit: Payer: Self-pay

## 2020-07-29 ENCOUNTER — Inpatient Hospital Stay (HOSPITAL_COMMUNITY)
Admission: AD | Admit: 2020-07-29 | Discharge: 2020-07-31 | DRG: 787 | Disposition: A | Discharge status: Home / Self Care | Payer: BC Managed Care – PPO | Attending: Obstetrics & Gynecology | Admitting: Obstetrics & Gynecology

## 2020-07-29 ENCOUNTER — Encounter (HOSPITAL_COMMUNITY): Payer: Self-pay | Admitting: Obstetrics & Gynecology

## 2020-07-29 DIAGNOSIS — O9081 Anemia of the puerperium: Secondary | ICD-10-CM | POA: Diagnosis not present

## 2020-07-29 DIAGNOSIS — D62 Acute posthemorrhagic anemia: Secondary | ICD-10-CM | POA: Diagnosis not present

## 2020-07-29 DIAGNOSIS — O34211 Maternal care for low transverse scar from previous cesarean delivery: Principal | ICD-10-CM | POA: Diagnosis present

## 2020-07-29 DIAGNOSIS — O99893 Other specified diseases and conditions complicating puerperium: Secondary | ICD-10-CM | POA: Diagnosis not present

## 2020-07-29 DIAGNOSIS — Z8659 Personal history of other mental and behavioral disorders: Secondary | ICD-10-CM

## 2020-07-29 DIAGNOSIS — Z8759 Personal history of other complications of pregnancy, childbirth and the puerperium: Secondary | ICD-10-CM

## 2020-07-29 DIAGNOSIS — R112 Nausea with vomiting, unspecified: Secondary | ICD-10-CM | POA: Diagnosis not present

## 2020-07-29 DIAGNOSIS — Z20822 Contact with and (suspected) exposure to covid-19: Secondary | ICD-10-CM | POA: Diagnosis present

## 2020-07-29 DIAGNOSIS — O26893 Other specified pregnancy related conditions, third trimester: Secondary | ICD-10-CM | POA: Diagnosis present

## 2020-07-29 DIAGNOSIS — D649 Anemia, unspecified: Secondary | ICD-10-CM

## 2020-07-29 DIAGNOSIS — Z3A37 37 weeks gestation of pregnancy: Secondary | ICD-10-CM

## 2020-07-29 DIAGNOSIS — Z98891 History of uterine scar from previous surgery: Secondary | ICD-10-CM

## 2020-07-29 LAB — CBC
HCT: 34.2 % — ABNORMAL LOW (ref 36.0–46.0)
Hemoglobin: 10.9 g/dL — ABNORMAL LOW (ref 12.0–15.0)
MCH: 29.3 pg (ref 26.0–34.0)
MCHC: 31.9 g/dL (ref 30.0–36.0)
MCV: 91.9 fL (ref 80.0–100.0)
Platelets: 201 10*3/uL (ref 150–400)
RBC: 3.72 MIL/uL — ABNORMAL LOW (ref 3.87–5.11)
RDW: 13.5 % (ref 11.5–15.5)
WBC: 10.1 10*3/uL (ref 4.0–10.5)
nRBC: 0 % (ref 0.0–0.2)

## 2020-07-29 LAB — RESP PANEL BY RT-PCR (FLU A&B, COVID) ARPGX2
Influenza A by PCR: NEGATIVE
Influenza B by PCR: NEGATIVE
SARS Coronavirus 2 by RT PCR: NEGATIVE

## 2020-07-29 LAB — TYPE AND SCREEN
ABO/RH(D): A POS
Antibody Screen: NEGATIVE

## 2020-07-29 LAB — RPR: RPR Ser Ql: NONREACTIVE

## 2020-07-29 LAB — POCT FERN TEST: POCT Fern Test: POSITIVE

## 2020-07-29 SURGERY — Surgical Case
Anesthesia: Spinal

## 2020-07-29 MED ORDER — KETOROLAC TROMETHAMINE 30 MG/ML IJ SOLN
30.0000 mg | Freq: Once | INTRAMUSCULAR | Status: AC | PRN
  Administered 2020-07-29: 30 mg via INTRAVENOUS

## 2020-07-29 MED ORDER — PHENYLEPHRINE 40 MCG/ML (10ML) SYRINGE FOR IV PUSH (FOR BLOOD PRESSURE SUPPORT)
PREFILLED_SYRINGE | INTRAVENOUS | Status: DC | PRN
  Administered 2020-07-29: 80 ug via INTRAVENOUS

## 2020-07-29 MED ORDER — ONDANSETRON HCL 4 MG/2ML IJ SOLN
INTRAMUSCULAR | Status: DC | PRN
  Administered 2020-07-29: 4 mg via INTRAVENOUS

## 2020-07-29 MED ORDER — ONDANSETRON HCL 4 MG/2ML IJ SOLN
INTRAMUSCULAR | Status: AC
  Filled 2020-07-29: qty 2

## 2020-07-29 MED ORDER — DEXAMETHASONE SODIUM PHOSPHATE 10 MG/ML IJ SOLN
INTRAMUSCULAR | Status: DC | PRN
  Administered 2020-07-29: 10 mg via INTRAVENOUS

## 2020-07-29 MED ORDER — PHENYLEPHRINE HCL-NACL 20-0.9 MG/250ML-% IV SOLN
INTRAVENOUS | Status: DC | PRN
  Administered 2020-07-29: 60 ug/min via INTRAVENOUS

## 2020-07-29 MED ORDER — PRENATAL MULTIVITAMIN CH
1.0000 | ORAL_TABLET | Freq: Every day | ORAL | Status: DC
  Administered 2020-07-30: 1 via ORAL
  Filled 2020-07-29: qty 1

## 2020-07-29 MED ORDER — SIMETHICONE 80 MG PO CHEW
80.0000 mg | CHEWABLE_TABLET | Freq: Three times a day (TID) | ORAL | Status: DC
  Administered 2020-07-30 – 2020-07-31 (×4): 80 mg via ORAL
  Filled 2020-07-29 (×4): qty 1

## 2020-07-29 MED ORDER — PHENYLEPHRINE HCL-NACL 20-0.9 MG/250ML-% IV SOLN
INTRAVENOUS | Status: AC
  Filled 2020-07-29: qty 250

## 2020-07-29 MED ORDER — POVIDONE-IODINE 10 % EX SWAB: 2.0000 "application " | Freq: Once | CUTANEOUS | Status: DC

## 2020-07-29 MED ORDER — OXYCODONE HCL 5 MG/5ML PO SOLN: 5.0000 mg | Freq: Once | ORAL | Status: DC | PRN

## 2020-07-29 MED ORDER — ONDANSETRON HCL 4 MG/2ML IJ SOLN
4.0000 mg | Freq: Four times a day (QID) | INTRAMUSCULAR | Status: DC | PRN
  Administered 2020-07-29: 4 mg via INTRAVENOUS
  Filled 2020-07-29: qty 2

## 2020-07-29 MED ORDER — LACTATED RINGERS IV SOLN: INTRAVENOUS | Status: DC

## 2020-07-29 MED ORDER — OXYCODONE HCL 5 MG PO TABS
5.0000 mg | ORAL_TABLET | ORAL | Status: DC | PRN
  Administered 2020-07-30: 5 mg via ORAL
  Filled 2020-07-29: qty 1

## 2020-07-29 MED ORDER — DIPHENHYDRAMINE HCL 25 MG PO CAPS: 25.0000 mg | ORAL_CAPSULE | Freq: Four times a day (QID) | ORAL | Status: DC | PRN

## 2020-07-29 MED ORDER — HYDROMORPHONE HCL 1 MG/ML IJ SOLN: 0.2500 mg | INTRAMUSCULAR | Status: DC | PRN

## 2020-07-29 MED ORDER — DEXAMETHASONE SODIUM PHOSPHATE 10 MG/ML IJ SOLN
INTRAMUSCULAR | Status: AC
  Filled 2020-07-29: qty 1

## 2020-07-29 MED ORDER — FENTANYL CITRATE (PF) 100 MCG/2ML IJ SOLN
50.0000 ug | Freq: Once | INTRAMUSCULAR | Status: AC
Start: 2020-07-29 — End: 2020-07-29
  Administered 2020-07-29: 50 ug via INTRAVENOUS
  Filled 2020-07-29: qty 2

## 2020-07-29 MED ORDER — MORPHINE SULFATE (PF) 0.5 MG/ML IJ SOLN
INTRAMUSCULAR | Status: AC
  Filled 2020-07-29: qty 10

## 2020-07-29 MED ORDER — SENNOSIDES-DOCUSATE SODIUM 8.6-50 MG PO TABS
2.0000 | ORAL_TABLET | Freq: Every day | ORAL | Status: DC
  Administered 2020-07-30 – 2020-07-31 (×2): 2 via ORAL
  Filled 2020-07-29 (×2): qty 2

## 2020-07-29 MED ORDER — PHENYLEPHRINE 40 MCG/ML (10ML) SYRINGE FOR IV PUSH (FOR BLOOD PRESSURE SUPPORT)
PREFILLED_SYRINGE | INTRAVENOUS | Status: AC
  Filled 2020-07-29: qty 10

## 2020-07-29 MED ORDER — OXYCODONE HCL 5 MG PO TABS: 5.0000 mg | ORAL_TABLET | Freq: Once | ORAL | Status: DC | PRN

## 2020-07-29 MED ORDER — MORPHINE SULFATE (PF) 0.5 MG/ML IJ SOLN
INTRAMUSCULAR | Status: DC | PRN
  Administered 2020-07-29: 150 ug via EPIDURAL

## 2020-07-29 MED ORDER — FERROUS SULFATE 325 (65 FE) MG PO TABS
325.0000 mg | ORAL_TABLET | Freq: Every day | ORAL | Status: DC
  Administered 2020-07-30 – 2020-07-31 (×2): 325 mg via ORAL
  Filled 2020-07-29 (×3): qty 1

## 2020-07-29 MED ORDER — KETOROLAC TROMETHAMINE 30 MG/ML IJ SOLN
30.0000 mg | Freq: Four times a day (QID) | INTRAMUSCULAR | Status: AC
  Administered 2020-07-29 – 2020-07-30 (×3): 30 mg via INTRAVENOUS
  Filled 2020-07-29 (×3): qty 1

## 2020-07-29 MED ORDER — ACETAMINOPHEN 500 MG PO TABS
1000.0000 mg | ORAL_TABLET | Freq: Four times a day (QID) | ORAL | Status: DC
  Administered 2020-07-29 – 2020-07-31 (×6): 1000 mg via ORAL
  Filled 2020-07-29 (×6): qty 2

## 2020-07-29 MED ORDER — FENTANYL CITRATE (PF) 100 MCG/2ML IJ SOLN
INTRAMUSCULAR | Status: AC
  Filled 2020-07-29: qty 2

## 2020-07-29 MED ORDER — OXYTOCIN-SODIUM CHLORIDE 30-0.9 UT/500ML-% IV SOLN
INTRAVENOUS | Status: DC | PRN
  Administered 2020-07-29: 300 mL via INTRAVENOUS

## 2020-07-29 MED ORDER — SCOPOLAMINE 1 MG/3DAYS TD PT72
1.0000 | MEDICATED_PATCH | TRANSDERMAL | Status: DC
  Administered 2020-07-29: 1.5 mg via TRANSDERMAL
  Filled 2020-07-29: qty 1

## 2020-07-29 MED ORDER — OXYTOCIN-SODIUM CHLORIDE 30-0.9 UT/500ML-% IV SOLN
INTRAVENOUS | Status: AC
  Filled 2020-07-29: qty 500

## 2020-07-29 MED ORDER — TETANUS-DIPHTH-ACELL PERTUSSIS 5-2.5-18.5 LF-MCG/0.5 IM SUSY: 0.5000 mL | PREFILLED_SYRINGE | Freq: Once | INTRAMUSCULAR | Status: DC

## 2020-07-29 MED ORDER — SIMETHICONE 80 MG PO CHEW: 80.0000 mg | CHEWABLE_TABLET | ORAL | Status: DC | PRN

## 2020-07-29 MED ORDER — BUPIVACAINE IN DEXTROSE 0.75-8.25 % IT SOLN
INTRATHECAL | Status: DC | PRN
  Administered 2020-07-29: 12 mg via INTRATHECAL

## 2020-07-29 MED ORDER — OXYTOCIN-SODIUM CHLORIDE 30-0.9 UT/500ML-% IV SOLN: 2.5000 [IU]/h | INTRAVENOUS | Status: AC

## 2020-07-29 MED ORDER — SODIUM CHLORIDE 0.9 % IR SOLN
Status: DC | PRN
  Administered 2020-07-29: 1

## 2020-07-29 MED ORDER — DIBUCAINE (PERIANAL) 1 % EX OINT: 1.0000 "application " | TOPICAL_OINTMENT | CUTANEOUS | Status: DC | PRN

## 2020-07-29 MED ORDER — IBUPROFEN 600 MG PO TABS
600.0000 mg | ORAL_TABLET | Freq: Four times a day (QID) | ORAL | Status: DC
  Administered 2020-07-30 – 2020-07-31 (×4): 600 mg via ORAL
  Filled 2020-07-29 (×4): qty 1

## 2020-07-29 MED ORDER — ONDANSETRON HCL 4 MG/2ML IJ SOLN: 4.0000 mg | Freq: Once | INTRAMUSCULAR | Status: DC | PRN

## 2020-07-29 MED ORDER — FENTANYL CITRATE (PF) 100 MCG/2ML IJ SOLN
INTRAMUSCULAR | Status: DC | PRN
  Administered 2020-07-29: 15 ug via INTRATHECAL

## 2020-07-29 MED ORDER — MENTHOL 3 MG MT LOZG: 1.0000 | LOZENGE | OROMUCOSAL | Status: DC | PRN

## 2020-07-29 MED ORDER — STERILE WATER FOR IRRIGATION IR SOLN
Status: DC | PRN
  Administered 2020-07-29: 1

## 2020-07-29 MED ORDER — KETOROLAC TROMETHAMINE 30 MG/ML IJ SOLN
INTRAMUSCULAR | Status: AC
  Filled 2020-07-29: qty 1

## 2020-07-29 MED ORDER — COCONUT OIL OIL: 1.0000 "application " | TOPICAL_OIL | Status: DC | PRN

## 2020-07-29 MED ORDER — FAMOTIDINE IN NACL 20-0.9 MG/50ML-% IV SOLN
20.0000 mg | Freq: Two times a day (BID) | INTRAVENOUS | Status: DC
  Administered 2020-07-29: 20 mg via INTRAVENOUS
  Filled 2020-07-29 (×4): qty 50

## 2020-07-29 MED ORDER — ZOLPIDEM TARTRATE 5 MG PO TABS: 5.0000 mg | ORAL_TABLET | Freq: Every evening | ORAL | Status: DC | PRN

## 2020-07-29 MED ORDER — METOCLOPRAMIDE HCL 10 MG PO TABS
10.0000 mg | ORAL_TABLET | Freq: Three times a day (TID) | ORAL | Status: DC
  Administered 2020-07-29 – 2020-07-31 (×5): 10 mg via ORAL
  Filled 2020-07-29 (×5): qty 1

## 2020-07-29 MED ORDER — CEFAZOLIN SODIUM-DEXTROSE 2-4 GM/100ML-% IV SOLN
2.0000 g | INTRAVENOUS | Status: AC
  Administered 2020-07-29: 2 g via INTRAVENOUS
  Filled 2020-07-29: qty 100

## 2020-07-29 MED ORDER — WITCH HAZEL-GLYCERIN EX PADS: 1.0000 "application " | MEDICATED_PAD | CUTANEOUS | Status: DC | PRN

## 2020-07-29 SURGICAL SUPPLY — 35 items
BENZOIN TINCTURE PRP APPL 2/3 (GAUZE/BANDAGES/DRESSINGS) ×2 IMPLANT
CHLORAPREP W/TINT 26ML (MISCELLANEOUS) ×2 IMPLANT
CLAMP CORD UMBIL (MISCELLANEOUS) IMPLANT
CLOSURE STERI STRIP 1/2 X4 (GAUZE/BANDAGES/DRESSINGS) ×2 IMPLANT
CLOTH BEACON ORANGE TIMEOUT ST (SAFETY) ×2 IMPLANT
DRSG OPSITE POSTOP 4X10 (GAUZE/BANDAGES/DRESSINGS) ×2 IMPLANT
ELECT REM PT RETURN 9FT ADLT (ELECTROSURGICAL) ×2
ELECTRODE REM PT RTRN 9FT ADLT (ELECTROSURGICAL) ×1 IMPLANT
EXTRACTOR VACUUM KIWI (MISCELLANEOUS) IMPLANT
EXTRACTOR VACUUM M CUP 4 TUBE (SUCTIONS) IMPLANT
GLOVE BIO SURGEON STRL SZ7 (GLOVE) ×2 IMPLANT
GLOVE BIOGEL PI IND STRL 7.0 (GLOVE) ×2 IMPLANT
GLOVE BIOGEL PI INDICATOR 7.0 (GLOVE) ×2
GOWN STRL REUS W/TWL LRG LVL3 (GOWN DISPOSABLE) ×4 IMPLANT
KIT ABG SYR 3ML LUER SLIP (SYRINGE) IMPLANT
NEEDLE HYPO 25X5/8 SAFETYGLIDE (NEEDLE) IMPLANT
NS IRRIG 1000ML POUR BTL (IV SOLUTION) ×2 IMPLANT
PACK C SECTION WH (CUSTOM PROCEDURE TRAY) ×2 IMPLANT
PAD OB MATERNITY 4.3X12.25 (PERSONAL CARE ITEMS) ×2 IMPLANT
RTRCTR C-SECT PINK 25CM LRG (MISCELLANEOUS) IMPLANT
STRIP CLOSURE SKIN 1/2X4 (GAUZE/BANDAGES/DRESSINGS) IMPLANT
SUT MNCRL 0 VIOLET CTX 36 (SUTURE) ×2 IMPLANT
SUT MONOCRYL 0 CTX 36 (SUTURE) ×2
SUT PLAIN 0 NONE (SUTURE) IMPLANT
SUT PLAIN 2 0 (SUTURE)
SUT PLAIN ABS 2-0 CT1 27XMFL (SUTURE) IMPLANT
SUT VIC AB 0 CT1 27 (SUTURE) ×2
SUT VIC AB 0 CT1 27XBRD ANBCTR (SUTURE) ×2 IMPLANT
SUT VIC AB 2-0 CT1 27 (SUTURE) ×1
SUT VIC AB 2-0 CT1 TAPERPNT 27 (SUTURE) ×1 IMPLANT
SUT VIC AB 4-0 KS 27 (SUTURE) ×2 IMPLANT
SUT VICRYL 0 TIES 12 18 (SUTURE) IMPLANT
TOWEL OR 17X24 6PK STRL BLUE (TOWEL DISPOSABLE) ×2 IMPLANT
TRAY FOLEY W/BAG SLVR 14FR LF (SET/KITS/TRAYS/PACK) IMPLANT
WATER STERILE IRR 1000ML POUR (IV SOLUTION) ×2 IMPLANT

## 2020-07-29 NOTE — MAU Note (Signed)
Informed Marcelino Duster, OB OR Charge Nurse, that patients labs have resulted.

## 2020-07-29 NOTE — Transfer of Care (Signed)
Immediate Anesthesia Transfer of Care Note  Patient: Robin Berry  Procedure(s) Performed: CESAREAN SECTION  Patient Location: PACU  Anesthesia Type:Spinal  Level of Consciousness: awake  Airway & Oxygen Therapy: Patient Spontanous Breathing  Post-op Assessment: Report given to RN  Post vital signs: Reviewed and stable  Last Vitals:  Vitals Value Taken Time  BP 117/61 07/29/20 1320  Temp    Pulse 99 07/29/20 1324  Resp 20 07/29/20 1324  SpO2 95 % 07/29/20 1324  Vitals shown include unvalidated device data.  Last Pain:  Vitals:   07/29/20 1030  TempSrc:   PainSc: 10-Worst pain ever      Patients Stated Pain Goal: 0 (07/29/20 0747)  Complications: No notable events documented.

## 2020-07-29 NOTE — H&P (Addendum)
Robin Berry is a 30 y.o. female MF, G3P2002, 37.2 wks by [facility] of 08/17/20 confirmed by 1st trim sono. She presents with SROM at 5 AM and contractions since 6 am. No vag bleeding. + Fms. + pelvic pressure. [facility] 8 weeks. Maternal anemia H/x C/s x 2 2015, 2016  Fetal pyelectasis at anatomy sono but resolved at 28 wk follow up GBS(-)   ObHx- G3P2002, C/s x 2 Girl 2015 (at [facility], [place]) Boy 2016 [facility])  Past Medical History:  Diagnosis Date   Anemia    Past Surgical History:  Procedure Laterality Date   APPENDECTOMY     CESAREAN SECTION N/A 05/27/2013   Procedure: CESAREAN SECTION;  Surgeon: Robley Fries, MD;  Location: [facility];  Service: Obstetrics;  Laterality: N/A;   CHOLECYSTECTOMY     Family History: family history is not on file. Social History:  reports that she does not drink alcohol. No history on file for tobacco use and drug use.     Maternal Diabetes: No Genetic Screening: Declined Maternal Ultrasounds/Referrals: Fetal renal pyelectasis at anatomy. Resolved at 28 wk sono  Fetal Ultrasounds or other Referrals:  None Maternal Substance Abuse:  No Significant Maternal Medications:  None Significant Maternal Lab Results:  Group B Strep negative Other Comments:  None  Review of Systems History Dilation: Fingertip Effacement (%): Thick Station: Ballotable Exam by:: V Fisher RN Blood pressure 121/83, pulse (!) 106, temperature 98.2 F (36.8 C), temperature source Oral, resp. rate 17, SpO2 96 %, unknown if currently breastfeeding. Exam Physical Exam  Physical exam:  A&O x 3, no acute distress. Pleasant HEENT neg, no thyromegaly Lungs CTA bilat CV RRR, S1S2 normal Gravid S>D, Vx,  Abdo soft, non tender, non acute Extr no edema/ tenderness Pelvic above per RN, fluid ++, fern + FHT 150s  Toco q 3-4 min, reporting pain   Prenatal labs: ABO, Rh: A+  01/28/2020 Antibody: Neg   01/28/2020  Rubella:  Immune   01/28/2020 RPR:   Neg x 2    01/28/2020  HBsAg: Neg    01/28/2020 HIV:   Neg    01/28/2020 GBS:   Neg   07/17/2020  Glucola nl   05/26/2020  AFP1 neg   02/25/2020   Assessment/Plan: 30 yo G3P2002, 37.2 wks, with SROM and early labor. FHT cat I . No TL request.  Prior C/s x 2, will proceed with C/section today, labs Covid19 results pending. OR and Anesthesia team aware Risks/complications of surgery reviewed incl infection, bleeding, damage to internal organs including bladder, bowels, ureters, blood vessels, other risks from anesthesia, VTE and delayed complications of any surgery, complications in future surgery reviewed. Also discussed neonatal complications incl difficult delivery, laceration, vacuum assistance, TTN etc. Pt understands and agrees, all concerns addressed.     Robley Fries 07/29/2020, 9:11 AM

## 2020-07-29 NOTE — Anesthesia Preprocedure Evaluation (Addendum)
Anesthesia Evaluation  Patient identified by MRN, date of birth, ID band Patient awake    Reviewed: Allergy & Precautions, NPO status , Patient's Chart, lab work & pertinent test results  Airway Mallampati: III  TM Distance: >3 FB Neck ROM: Full    Dental no notable dental hx.    Pulmonary neg pulmonary ROS,    Pulmonary exam normal breath sounds clear to auscultation       Cardiovascular negative cardio ROS Normal cardiovascular exam Rhythm:Regular Rate:Normal     Neuro/Psych negative neurological ROS  negative psych ROS   GI/Hepatic negative GI ROS, Neg liver ROS,   Endo/Other  negative endocrine ROS  Renal/GU negative Renal ROS     Musculoskeletal   Abdominal (+) + obese,   Peds  Hematology Lab Results      Component                Value               Date                      WBC                      10.1                07/29/2020                HGB                      10.9 (L)            07/29/2020                HCT                      34.2 (L)            07/29/2020                MCV                      91.9                07/29/2020                PLT                      201                 07/29/2020              Anesthesia Other Findings   Reproductive/Obstetrics (+) Pregnancy                           Anesthesia Physical Anesthesia Plan  ASA: 3  Anesthesia Plan: Spinal   Post-op Pain Management:    Induction:   PONV Risk Score and Plan: 3 and Treatment may vary due to age or medical condition, Ondansetron and Midazolam  Airway Management Planned: Natural Airway  Additional Equipment: None  Intra-op Plan:   Post-operative Plan:   Informed Consent: I have reviewed the patients History and Physical, chart, labs and discussed the procedure including the risks, benefits and alternatives for the proposed anesthesia with the patient or authorized representative who  has indicated his/her understanding and acceptance.       Plan Discussed with:  CRNA  Anesthesia Plan Comments: (37.2 wk G3P2 for rpt C/S x3)        Anesthesia Quick Evaluation

## 2020-07-29 NOTE — Progress Notes (Signed)
Rn called Dr. Juliene Pina due to patient having nausea and vomiting. Patient states that she does not want Zofran due to they way it makes her feel. New orders were given . See Mar.Will continue to monitor.Bowel sound present.

## 2020-07-29 NOTE — Progress Notes (Signed)
Pericare given

## 2020-07-29 NOTE — MAU Note (Signed)
Pt reports to mau with reports of possible SROM at 0500 with clear fluid.  Pt reports ctx started about 30 min later.  Reports ctx are about 5-6 min.

## 2020-07-29 NOTE — Op Note (Signed)
Procedure note 07/29/2020  Robin Berry  Procedure: Urgent Repeat Low Transverse Cesarean Section  Indications:  37.2 weeks SROM and early labor, H/o C-section x 2     Pre-operative Diagnosis: SROM- repeat c/s.   Post-operative Diagnosis: Same   Surgeon:  Shea Evans, MD   Assistants: none  Anesthesia: spinal   Procedure Details:  The patient was seen in the Holding Room. The risks, benefits, complications, treatment options, and expected outcomes were discussed with the patient. The patient concurred with the proposed plan, giving informed consent. identified as Robin Berry and the procedure verified as C-Section Delivery. A Time Out was held and the above information confirmed. 2 gm Ancef given.  After induction of anesthesia, the patient was draped and prepped in the usual sterile manner, foley was draining urine well.  A pfannenstiel incision was made and carried down through the subcutaneous tissue to the fascia. Fascial incision was made and extended transversely. The fascia was separated from the underlying rectus tissue superiorly and inferiorly. The peritoneum was identified and entered. Peritoneal incision was extended longitudinally. Alexis-O retractor placed. The utero-vesical peritoneal reflection was incised transversely and the bladder flap was bluntly freed from the lower uterine segment. A low transverse uterine incision was made. Lower segment was quite thin and bladder was noted to be adhesed high. Hysterotomy was uneventful. Clear amniotic fluid drained. Delivered from cephalic presentation was a Female infant with vigorous cry at 12.21 PM. Apgar scores of 9 at one minute and 9 at five minutes. Delayed cord clamping done at 1 minute and baby handed to NICU team in attendance. Cord ph was not sent. Cord blood was obtained for evaluation. The placenta was removed Intact and appeared normal. The uterine outline, tubes and ovaries appeared normal. The uterine  incision was closed with running locked sutures of 0- Monocryl.  A second imbricating layer sutured.   Hemostasis was observed. Alexis retractor removed. Peritoneal closure done with 2-0 Vicryl.  The fascia was then reapproximated with running sutures of 0Vicryl. The subcuticular closure was performed using 2-0plain gut. The skin was closed with 4-0Vicryl. Steristrips, Honeycomb dressing placed.   Instrument, sponge, and needle counts were correct prior the abdominal closure and were correct at the conclusion of the case.   Findings- thin lower segment but no dehiscence. Baby girl delivered cephalic from [facility] hysterotomy. 2 layer closure, Normal tubes and ovaries.    Estimated Blood Loss: 350 cc (200 + fundal massage)  Total IV Fluids: LR 1300 ml   Urine Output:  100CC OF clear urine  Specimens: cord blood    Complications: no complications  Disposition: PACU - hemodynamically stable.   Maternal Condition: stable   Baby condition / location:  Couplet care / Skin to Skin  Attending Attestation: I performed the procedure.   Signed: Surgeon(s): Shea Evans, MD

## 2020-07-29 NOTE — Progress Notes (Signed)
If SROM, she is to have her 3rd C/s today. Please prepare for surgery --V.Duana Benedict MD

## 2020-07-29 NOTE — Anesthesia Procedure Notes (Addendum)
Spinal  Patient location during procedure: OB Start time: 07/29/2020 11:49 AM End time: 07/29/2020 11:52 AM Reason for block: surgical anesthesia Staffing Performed: anesthesiologist  Anesthesiologist: [name], MD Preanesthetic Checklist Completed: patient identified, IV checked, risks and benefits discussed, surgical consent, monitors and equipment checked, pre-op evaluation and timeout performed Spinal Block Patient position: sitting Prep: DuraPrep and site prepped and draped Patient monitoring: heart rate, cardiac monitor, continuous pulse ox and blood pressure Approach: midline Location: L3-4 Injection technique: single-shot Needle Needle type: Pencan  Needle gauge: 24 G Needle length: 10 cm Needle insertion depth: 7 cm Assessment Sensory level: T4 Events: CSF return Additional Notes  1Attempt (s). Pt tolerated procedure well.

## 2020-07-29 NOTE — Anesthesia Postprocedure Evaluation (Signed)
Anesthesia Post Note  Patient: Robin Berry  Procedure(s) Performed: CESAREAN SECTION     Patient location during evaluation: Mother Baby Anesthesia Type: Spinal Level of consciousness: oriented and awake and alert Pain management: pain level controlled Vital Signs Assessment: post-procedure vital signs reviewed and stable Respiratory status: spontaneous breathing and respiratory function stable Cardiovascular status: blood pressure returned to baseline and stable Postop Assessment: no headache, no backache, no apparent nausea or vomiting and able to ambulate Anesthetic complications: no   No notable events documented.  Last Vitals:  Vitals:   07/29/20 1330 07/29/20 1346  BP: 108/73 108/64  Pulse: 88 76  Resp: 13 20  Temp:    SpO2: 97% 95%    Last Pain:  Vitals:   07/29/20 1330  TempSrc:   PainSc: 0-No pain   Pain Goal: Patients Stated Pain Goal: 0 (07/29/20 0747)  LLE Motor Response: No movement due to regional block (07/29/20 1330) LLE Sensation: Numbness (07/29/20 1330) RLE Motor Response: No movement due to regional block (07/29/20 1330) RLE Sensation: Numbness (07/29/20 1330)        [name]

## 2020-07-29 NOTE — Progress Notes (Signed)
Patient ID: Robin Berry, female   DOB: 05/01/1990, 30 y.o.   MRN: [id number] RN called earlier and now again with pt c/o nausea/ vomiting. No more vomiting now but nausea is ongoing and doesn't want to continue Zofran that was given 4 hrs back. Will keep scope patch and add Reglan 10 mg q8hrs, Pepcid 20mg  q 12hr and NPO or bland diet and observe closely

## 2020-07-30 ENCOUNTER — Encounter (HOSPITAL_COMMUNITY): Payer: Self-pay | Admitting: Obstetrics & Gynecology

## 2020-07-30 LAB — CBC
HCT: 27.9 % — ABNORMAL LOW (ref 36.0–46.0)
Hemoglobin: 9.1 g/dL — ABNORMAL LOW (ref 12.0–15.0)
MCH: 30.1 pg (ref 26.0–34.0)
MCHC: 32.6 g/dL (ref 30.0–36.0)
MCV: 92.4 fL (ref 80.0–100.0)
Platelets: 196 10*3/uL (ref 150–400)
RBC: 3.02 MIL/uL — ABNORMAL LOW (ref 3.87–5.11)
RDW: 13.3 % (ref 11.5–15.5)
WBC: 16.7 10*3/uL — ABNORMAL HIGH (ref 4.0–10.5)
nRBC: 0 % (ref 0.0–0.2)

## 2020-07-30 MED ORDER — FERROUS SULFATE 325 (65 FE) MG PO TABS: 325.0000 mg | ORAL_TABLET | Freq: Every day | ORAL | Status: DC

## 2020-07-30 NOTE — Progress Notes (Signed)
Subjective: Postpartum Day 1, Repeat 3rd LTCS, at 37.2 wks with SROM and labor.  Girl, breast feeding   Patient reports feeling better, N/V resolved, Zofran f/by Pepcid helped. Has not needed Reglan. Ambulating, voiding since foley out. Bleeding is not heavy. Pain well controlled.   Objective: Vital signs in last 24 hours: Temp:  [97.7 F (36.5 C)-98.2 F (36.8 C)] 97.8 F (36.6 C) (07/10 0340) Pulse Rate:  [62-99] 65 (07/10 0340) Resp:  [12-20] 18 (07/10 0340) BP: (102-141)/(55-77) 127/55 (07/10 0340) SpO2:  [93 %-100 %] 100 % (07/10 0340)  Physical Exam:  General: alert and cooperative Lochia: appropriate CV RRR Lungs CTA b/l Uterine Fundus: firm, fundus 2-U  Incision: Dressing has some old drainage, no hematoma  DVT Evaluation: No evidence of DVT seen on physical exam.  CBC Latest Ref Rng & Units 07/30/2020 07/29/2020   WBC 4.0 - 10.5 K/uL 16.7(H) 10.1   Hemoglobin 12.0 - 15.0 g/dL 0.0(Q) 10.9(L)   Hematocrit 36.0 - 46.0 % 27.9(L) 34.2(L)   Platelets 150 - 400 K/uL 196 201     Rh pos  Rub Imm   Assessment/Plan: Status post Cesarean section. Doing well postoperatively.  Continue current care, lactation support. No PP depression hx and good home support  Start Iron  PP care/ POstop care d/w pt, anticipate D/c home tomorrow .  Robley Fries 07/30/2020, 9:58 AM

## 2020-07-31 DIAGNOSIS — Z8659 Personal history of other mental and behavioral disorders: Secondary | ICD-10-CM

## 2020-07-31 DIAGNOSIS — D649 Anemia, unspecified: Secondary | ICD-10-CM

## 2020-07-31 MED ORDER — FERROUS SULFATE 325 (65 FE) MG PO TABS: 325.0000 mg | ORAL_TABLET | Freq: Every day | ORAL | 3 refills | Status: DC

## 2020-07-31 MED ORDER — COCONUT OIL OIL: 1.0000 "application " | TOPICAL_OIL | 0 refills | Status: DC | PRN

## 2020-07-31 MED ORDER — OXYCODONE HCL 5 MG PO TABS: 5.0000 mg | ORAL_TABLET | ORAL | 0 refills | Status: DC | PRN

## 2020-07-31 MED ORDER — IBUPROFEN 600 MG PO TABS: 600.0000 mg | ORAL_TABLET | Freq: Four times a day (QID) | ORAL | 0 refills | Status: DC

## 2020-07-31 MED ORDER — SENNOSIDES-DOCUSATE SODIUM 8.6-50 MG PO TABS: 2.0000 | ORAL_TABLET | Freq: Every evening | ORAL | 0 refills | Status: DC | PRN

## 2020-07-31 MED ORDER — ACETAMINOPHEN 500 MG PO TABS: 1000.0000 mg | ORAL_TABLET | Freq: Four times a day (QID) | ORAL | 0 refills | Status: AC

## 2020-07-31 MED ORDER — SIMETHICONE 80 MG PO CHEW: 80.0000 mg | CHEWABLE_TABLET | Freq: Three times a day (TID) | ORAL | 0 refills | Status: DC

## 2020-07-31 NOTE — Progress Notes (Signed)
POSTOPERATIVE DAY # 2 S/P Repeat LTCS for SROM and Labor at [redacted]w[redacted]d, baby girl "Haskel Schroeder"   S:         Reports feeling overall well, c/o some gas and incisional pain but controlled with medication. Desires early d/c home today              Tolerating po intake / no nausea / no vomiting / + flatus / no BM  Denies dizziness, SOB, or CP             Bleeding is light             Pain controlled with Motrin, Tylenol, and Oxycodone IR             Up ad lib / ambulatory/ voiding QS without difficulty   Newborn breast feeding - going well,using nipple shield on one side; baby cluster feeding overnight      O:  VS: BP 110/64 (BP Location: Right Arm)   Pulse 64   Temp (!) 97.5 F (36.4 C) (Oral)   Resp 18   SpO2 100%   Breastfeeding Unknown  Patient Vitals for the past 24 hrs:  BP Temp Temp src Pulse Resp SpO2  07/31/20 0521 110/64 (!) 97.5 F (36.4 C) Oral 64 18 100 %  07/30/20 2239 -- 98.1 F (36.7 C) Oral -- -- --  07/30/20 2023 (!) 105/51 99 F (37.2 C) Oral 79 17 100 %  07/30/20 1343 (!) 101/50 98.6 F (37 C) Oral 74 17 99 %  07/30/20 0845 (!) 97/50 98.6 F (37 C) Axillary 65 16 98 %     LABS:               Recent Labs    07/29/20 0759 07/30/20 0503  WBC 10.1 16.7*  HGB 10.9* 9.1*  PLT 201 196               Bloodtype: --/--/A POS (07/09 0815)  Rubella: Immune (01/07 0000)                                             I&O: Intake/Output      07/10 0701 07/11 0700 07/11 0701 07/12 0700   P.O.     I.V.     IV Piggyback     Total Intake     Urine 400    Emesis/NG output     Blood     Total Output 400    Net -400                      Physical Exam:             Alert and Oriented X3  Lungs: Clear and unlabored  Heart: regular rate and rhythm / no murmurs  Abdomen: soft, non-tender,moderate gaseous distention, active bowel sounds              Fundus: firm, non-tender, U-2             Dressing: honeycomb with steri-strips c/d/i              Incision:   approximated with sutures / no erythema / no ecchymosis / no drainage  Perineum: intact  Lochia: appropriate   Extremities: no edema, no calf pain or tenderness,   A/P:     POD # 2 S/P Repeat  LTCS            Acute on chronic ABL Anemia   - stable on oral FE daily and continue x 6 weeks   Ambulation and warm liquids to promote gut motility   Ambulation for DVT prevention  No hx of PPD - EPDS score 0 Routine postoperative care              Discharge home today  WOB discharge book given, instructions and warning s/s reviewed  F/u in 6 weeks with Dr. Corky Crafts, MSN, CNM [facility]

## 2020-07-31 NOTE — Discharge Summary (Signed)
Postpartum Discharge Summary  Date of Service updated 07/31/2020     Patient Name: Robin Berry DOB: 1990-02-02 MRN: [id number]  Date of admission: 07/29/2020 Delivery date:07/29/2020  Delivering provider: MODY, VAISHALI  Date of discharge: 07/31/2020  Admitting diagnosis: Status post repeat low transverse cesarean section [Z98.891] Intrauterine pregnancy: [redacted]w[redacted]d    Secondary diagnosis:  Principal Problem:   Postpartum care following cesarean delivery (7/9) Active Problems:   Cesarean delivery delivered   Status post repeat low transverse cesarean section   Acute on chronic anemia  Additional problems: none    Discharge diagnosis: Term Pregnancy Delivered and Anemia                                              Post partum procedures: n/a Augmentation: N/A Complications: None  Hospital course: Sceduled C/S   30y.o. yo G3P3003 at 378w2das admitted to the hospital 07/29/2020 for scheduled cesarean section with the following indication:Elective Repeat and SROM in labor .Delivery details are as follows:  Membrane Rupture Time/Date: 5:00 AM ,07/29/2020   Delivery Method:C-Section, Low Transverse  Details of operation can be found in separate operative note.  Patient had an uncomplicated postpartum course.  She is ambulating, tolerating a regular diet, passing flatus, and urinating well. Patient is discharged home in stable condition on  07/31/20        Newborn Data: Birth date:07/29/2020  Birth time:12:21 PM  Gender:Female  Living status:Living  Apgars:9 ,9  Weight:3320 g   "SoGabriela Eves  Magnesium Sulfate received: No BMZ received: No Rhophylac:N/A MMR:N/A T-DaP:Given prenatally Flu: N/A Transfusion:No  Physical exam  Vitals:   07/30/20 1343 07/30/20 2023 07/30/20 2239 07/31/20 0521  BP: (!) 101/50 (!) 105/51  110/64  Pulse: 74 79  64  Resp: 17 17  18   Temp: 98.6 F (37 C) 99 F (37.2 C) 98.1 F (36.7 C) (!) 97.5 F (36.4 C)  TempSrc: Oral Oral Oral Oral   SpO2: 99% 100%  100%   Alert and Oriented X3  Lungs: Clear and unlabored  Heart: regular rate and rhythm / no murmurs  Abdomen: soft, non-tender,moderate gaseous distention, active bowel sounds              Fundus: firm, non-tender, U-2             Dressing: honeycomb with steri-strips c/d/i              Incision:  approximated with sutures / no erythema / no ecchymosis / no drainage  Perineum: intact  Lochia: appropriate   Extremities: no edema, no calf pain or tenderness Labs: Lab Results  Component Value Date   WBC 16.7 (H) 07/30/2020   HGB 9.1 (L) 07/30/2020   HCT 27.9 (L) 07/30/2020   MCV 92.4 07/30/2020   PLT 196 07/30/2020   No flowsheet data found. Edinburgh Score: Edinburgh Postnatal Depression Scale Screening Tool 07/31/2020  I have been able to laugh and see the funny side of things. 0  I have looked forward with enjoyment to things. 0  I have blamed myself unnecessarily when things went wrong. 0  I have been anxious or worried for no good reason. 0  I have felt scared or panicky for no good reason. 0  Things have been getting on top of me. 0  I have been so unhappy that I  have had difficulty sleeping. 0  I have felt sad or miserable. 0  I have been so unhappy that I have been crying. 0  The thought of harming myself has occurred to me. 0  Edinburgh Postnatal Depression Scale Total 0      After visit meds:  Allergies as of 07/31/2020       Reactions   Lemon Flavor Shortness Of Breath, Swelling   Mouth and throat swelling        Medication List     STOP taking these medications    diphenhydrAMINE 25 MG tablet Commonly known as: BENADRYL   oxyCODONE-acetaminophen 5-325 MG tablet Commonly known as: PERCOCET/ROXICET       TAKE these medications    acetaminophen 500 MG tablet Commonly known as: TYLENOL Take 2 tablets (1,000 mg total) by mouth every 6 (six) hours. What changed:  when to take this reasons to take this   coconut oil  Oil Apply 1 application topically as needed.   ferrous sulfate 325 (65 FE) MG tablet Take 1 tablet (325 mg total) by mouth daily.   ibuprofen 600 MG tablet Commonly known as: ADVIL Take 1 tablet (600 mg total) by mouth every 6 (six) hours.   oxyCODONE 5 MG immediate release tablet Commonly known as: Oxy IR/ROXICODONE Take 1-2 tablets (5-10 mg total) by mouth every 4 (four) hours as needed for moderate pain.   prenatal multivitamin Tabs tablet Take 1 tablet by mouth at bedtime.   senna-docusate 8.6-50 MG tablet Commonly known as: Senokot-S Take 2 tablets by mouth at bedtime as needed for mild constipation.   simethicone 80 MG chewable tablet Commonly known as: MYLICON Chew 1 tablet (80 mg total) by mouth 3 (three) times daily after meals.         Discharge home in stable condition Infant Feeding: Breast Infant Disposition:home with mother Discharge instruction: per After Visit Summary and Postpartum booklet. Activity: Advance as tolerated. Pelvic rest for 6 weeks.  Diet: low salt diet Anticipated Birth Control:  not discussed Postpartum Appointment:6 weeks Additional Postpartum F/U: Postpartum Depression checkup Future Appointments: Future Appointments  Date Time Provider [place]  08/09/2020 11:00 AM [facility]EN[facility] None  08/09/2020 11:30 AM [facility] PAT 1 [facility]INDC None   Follow up Visit:  Follow-up Information     Azucena Fallen, MD. Schedule an appointment as soon as possible for a visit in 6 week(s).   Specialty: Obstetrics and Gynecology Why: Postpartum visit Contact information: [place] [address] [phone]                     07/31/2020 Darliss Cheney, CNM

## 2020-08-09 ENCOUNTER — Inpatient Hospital Stay (HOSPITAL_COMMUNITY): Admission: RE | Admit: 2020-08-09 | Payer: BC Managed Care – PPO | Source: Ambulatory Visit

## 2020-08-09 ENCOUNTER — Encounter (HOSPITAL_COMMUNITY)
Admission: RE | Admit: 2020-08-09 | Discharge: 2020-08-09 | Disposition: A | Discharge status: Home / Self Care | Payer: BC Managed Care – PPO | Source: Ambulatory Visit | Attending: Obstetrics & Gynecology | Admitting: Obstetrics & Gynecology

## 2020-08-09 ENCOUNTER — Other Ambulatory Visit (HOSPITAL_COMMUNITY): Payer: BC Managed Care – PPO

## 2020-08-10 ENCOUNTER — Telehealth (HOSPITAL_COMMUNITY): Payer: Self-pay | Admitting: *Deleted

## 2020-08-10 NOTE — Telephone Encounter (Signed)
Attempted Hospital Discharge Follow-Up Call.  Left voice mail message for patient to return RN's call.

## 2020-08-11 ENCOUNTER — Inpatient Hospital Stay (HOSPITAL_COMMUNITY)
Admission: AD | Admit: 2020-08-11 | Payer: BC Managed Care – PPO | Source: Home / Self Care | Admitting: Obstetrics & Gynecology

## 2020-08-11 ENCOUNTER — Encounter (HOSPITAL_COMMUNITY): Admission: AD | Payer: Self-pay | Source: Home / Self Care

## 2020-08-11 SURGERY — Surgical Case
Anesthesia: Spinal

## 2020-09-09 ENCOUNTER — Ambulatory Visit (HOSPITAL_COMMUNITY)
Admission: EM | Admit: 2020-09-09 | Discharge: 2020-09-09 | Disposition: A | Discharge status: Home / Self Care | Payer: BC Managed Care – PPO | Attending: Emergency Medicine | Admitting: Emergency Medicine

## 2020-09-09 ENCOUNTER — Encounter (HOSPITAL_COMMUNITY): Payer: Self-pay | Admitting: Emergency Medicine

## 2020-09-09 DIAGNOSIS — N61 Mastitis without abscess: Secondary | ICD-10-CM | POA: Diagnosis not present

## 2020-09-09 MED ORDER — DICLOXACILLIN SODIUM 500 MG PO CAPS: 500.0000 mg | ORAL_CAPSULE | Freq: Four times a day (QID) | ORAL | 0 refills | Status: AC

## 2020-09-09 NOTE — ED Triage Notes (Signed)
Pt presents with left breast pain xs 4 days. States currently breast feeding.

## 2020-09-09 NOTE — Discharge Instructions (Addendum)
Take the dicloxacillin 4 times a day for the next 10 days.   Continuing to breastfeed or pump from both breasts to allow adequate milk flow and prevent an abscess from forming.  Breastfeeding or pump from the affected side first. You can also try breast massage or apply warm compresses for comfort.   You can take Tylenol and/or Ibuprofen as needed for pain relief.   Follow up with your OB/GYN for re-evaluation.

## 2020-09-09 NOTE — ED Provider Notes (Signed)
[facility]    CSN: [id number] Arrival date & time: 09/09/20  1743      History   Chief Complaint Chief Complaint  Patient presents with   Breast Pain    left    HPI Robin Berry is a 30 y.o. female.   Here for evaluation of left breast pain that has been ongoing for the past 4 days.  Reports pain has gotten progressively worse over the past few days.  Patient is breastfeeding.  Has not tried any OTC medication or treatment.  Denies any trauma, injury, or other precipitating event.  Denies any specific alleviating or aggravating factors.  Denies any fevers, chest pain, shortness of breath, N/V/D, numbness, tingling, weakness, abdominal pain, or headaches.    The history is provided by the patient.   Past Medical History:  Diagnosis Date   Anemia     Patient Active Problem List   Diagnosis Date Noted   Postpartum care following cesarean delivery (7/9) 07/31/2020   Acute on chronic anemia 07/31/2020   Status post repeat low transverse cesarean section 07/29/2020   Cesarean delivery delivered 05/28/2013    Past Surgical History:  Procedure Laterality Date   APPENDECTOMY     CESAREAN SECTION N/A 05/27/2013   Procedure: CESAREAN SECTION;  Surgeon: Robley Fries, MD;  Location: [facility];  Service: Obstetrics;  Laterality: N/A;   CESAREAN SECTION N/A 07/29/2020   Procedure: CESAREAN SECTION;  Surgeon: Shea Evans, MD;  Location: [facility];  Service: Obstetrics;  Laterality: N/A;   CHOLECYSTECTOMY      OB History     Gravida  3   Para  3   Term  3   Preterm      AB      Living  3      SAB      IAB      Ectopic      Multiple  0   Live Births  2            Home Medications    Prior to Admission medications   Medication Sig Start Date End Date Taking? Authorizing Provider  dicloxacillin (DYNAPEN) 500 MG capsule Take 1 capsule (500 mg total) by mouth 4 (four) times daily for 10 days. 09/09/20 09/19/20 Yes Ivette Loyal, NP   acetaminophen (TYLENOL) 500 MG tablet Take 2 tablets (1,000 mg total) by mouth every 6 (six) hours. 07/31/20   Sigmon, Scarlette Slice, CNM  coconut oil OIL Apply 1 application topically as needed. 07/31/20   Sigmon, Scarlette Slice, CNM  ferrous sulfate 325 (65 FE) MG tablet Take 1 tablet (325 mg total) by mouth daily. 07/31/20 07/31/21  Sigmon, Scarlette Slice, CNM  ibuprofen (ADVIL) 600 MG tablet Take 1 tablet (600 mg total) by mouth every 6 (six) hours. 07/31/20   Sigmon, Scarlette Slice, CNM  oxyCODONE (OXY IR/ROXICODONE) 5 MG immediate release tablet Take 1-2 tablets (5-10 mg total) by mouth every 4 (four) hours as needed for moderate pain. 07/31/20   Karena Addison, CNM  Prenatal Vit-Fe Fumarate-FA (PRENATAL MULTIVITAMIN) TABS tablet Take 1 tablet by mouth at bedtime.    [provider]  senna-docusate (SENOKOT-S) 8.6-50 MG tablet Take 2 tablets by mouth at bedtime as needed for mild constipation. 07/31/20   Sigmon, Scarlette Slice, CNM  simethicone (MYLICON) 80 MG chewable tablet Chew 1 tablet (80 mg total) by mouth 3 (three) times daily after meals. 07/31/20   Karena Addison, CNM  Family History History reviewed. No pertinent family history.  Social History Social History   Tobacco Use   Smoking status: Never   Smokeless tobacco: Never  Substance Use Topics   Alcohol use: No     Allergies   Lemon flavor   Review of Systems Review of Systems  All other systems reviewed and are negative.   Physical Exam Triage Vital Signs ED Triage Vitals  Enc Vitals Group     BP 09/09/20 1821 (!) 137/92     Pulse Rate 09/09/20 1821 70     Resp 09/09/20 1821 16     Temp 09/09/20 1821 98.4 F (36.9 C)     Temp Source 09/09/20 1821 Oral     SpO2 09/09/20 1821 96 %     Weight --      Height --      Head Circumference --      Peak Flow --      Pain Score 09/09/20 1818 6     Pain Loc --      Pain Edu? --      Excl. in GC? --    No data found.  Updated Vital Signs BP (!) 137/92 (BP  Location: Left Arm)   Pulse 70   Temp 98.4 F (36.9 C) (Oral)   Resp 16   SpO2 96%   Breastfeeding Yes   Visual Acuity Right Eye Distance:   Left Eye Distance:   Bilateral Distance:    Right Eye Near:   Left Eye Near:    Bilateral Near:     Physical Exam Vitals and nursing note reviewed.  Constitutional:      General: She is not in acute distress.    Appearance: Normal appearance. She is not ill-appearing, toxic-appearing or diaphoretic.  HENT:     Head: Normocephalic and atraumatic.  Eyes:     Conjunctiva/sclera: Conjunctivae normal.  Cardiovascular:     Rate and Rhythm: Normal rate.     Pulses: Normal pulses.  Pulmonary:     Effort: Pulmonary effort is normal.  Chest:  Breasts:    Left: Tenderness (left lateral breast) present.  Abdominal:     General: Abdomen is flat.  Musculoskeletal:        General: Normal range of motion.     Cervical back: Normal range of motion.  Skin:    General: Skin is warm and dry.  Neurological:     General: No focal deficit present.     Mental Status: She is alert and oriented to person, place, and time.  Psychiatric:        Mood and Affect: Mood normal.     UC Treatments / Results  Labs (all labs ordered are listed, but only abnormal results are displayed) Labs Reviewed - No data to display  EKG   Radiology No results found.  Procedures Procedures (including critical care time)  Medications Ordered in UC Medications - No data to display  Initial Impression / Assessment and Plan / UC Course  I have reviewed the triage vital signs and the nursing notes.  Pertinent labs & imaging results that were available during my care of the patient were reviewed by me and considered in my medical decision making (see chart for details).    Assessment negative for red flags or concerns.  This is likely mastitis.  Will treat with dicloxacillin 4 times a day for the next 10 days.  Encouraged to continue breast-feeding or pumping  starting with the left breast.  May also try massage and warm compresses.  Tylenol and ibuprofen as needed for pain.  Follow-up with OB/GYN as scheduled. Final Clinical Impressions(s) / UC Diagnoses   Final diagnoses:  Mastitis     Discharge Instructions      Take the dicloxacillin 4 times a day for the next 10 days.   Continuing to breastfeed or pump from both breasts to allow adequate milk flow and prevent an abscess from forming.  Breastfeeding or pump from the affected side first. You can also try breast massage or apply warm compresses for comfort.   You can take Tylenol and/or Ibuprofen as needed for pain relief.   Follow up with your OB/GYN for re-evaluation.       ED Prescriptions     Medication Sig Dispense Auth. Provider   dicloxacillin (DYNAPEN) 500 MG capsule Take 1 capsule (500 mg total) by mouth 4 (four) times daily for 10 days. 40 capsule Ivette Loyal, NP      PDMP not reviewed this encounter.   Ivette Loyal, NP 09/09/20 [id number]

## 2020-09-23 LAB — HM PAP SMEAR: HPV, high-risk: NEGATIVE

## 2021-02-02 LAB — HM MAMMOGRAPHY

## 2021-03-07 ENCOUNTER — Other Ambulatory Visit: Payer: Self-pay | Admitting: Surgery

## 2021-03-07 DIAGNOSIS — K432 Incisional hernia without obstruction or gangrene: Secondary | ICD-10-CM

## 2021-03-28 ENCOUNTER — Ambulatory Visit: Payer: BC Managed Care – PPO | Admitting: Plastic Surgery

## 2021-03-28 ENCOUNTER — Encounter: Payer: Self-pay | Admitting: Plastic Surgery

## 2021-03-28 ENCOUNTER — Other Ambulatory Visit: Payer: Self-pay

## 2021-03-28 VITALS — BP 127/84 | HR 83 | Ht 60.0 in | Wt 186.0 lb

## 2021-03-28 DIAGNOSIS — Z411 Encounter for cosmetic surgery: Secondary | ICD-10-CM

## 2021-03-28 DIAGNOSIS — L987 Excessive and redundant skin and subcutaneous tissue: Secondary | ICD-10-CM

## 2021-03-28 DIAGNOSIS — K429 Umbilical hernia without obstruction or gangrene: Secondary | ICD-10-CM | POA: Diagnosis not present

## 2021-03-28 NOTE — Progress Notes (Signed)
? ?Referring Provider ?Stechschulte, Hyman Hopes, MD ?[address] ?[address] ?[place],  [place] [address]  ? ?CC:  ?Chief Complaint  ?Patient presents with  ? Consult  ?   ?  ?   ? ?Robin Berry is an 31 y.o. female.  ?HPI: Patient presents to discuss her abdominal contour.  She has a symptomatic umbilical hernia that she is planning to have repaired by Dr. Dossie Der.  She is interested in abdominoplasty at the same time.  She is bothered by the excess skin and overall abdominal contour.  She has had 3 children via C-section and is done having kids.  Other abdominal surgeries include appendectomy and cholecystectomy.  She does not smoke and is not diabetic.  She is at a stable weight. ? ?Allergies  ?Allergen Reactions  ? Lemon Flavor Shortness Of Breath and Swelling  ?  Mouth and throat swelling ?  ? ? ?Outpatient Encounter Medications as of 03/28/2021  ?Medication Sig  ? acetaminophen (TYLENOL) 500 MG tablet Take 2 tablets (1,000 mg total) by mouth every 6 (six) hours.  ? coconut oil OIL Apply 1 application topically as needed.  ? ferrous sulfate 325 (65 FE) MG tablet Take 1 tablet (325 mg total) by mouth daily.  ? oxyCODONE (OXY IR/ROXICODONE) 5 MG immediate release tablet Take 1-2 tablets (5-10 mg total) by mouth every 4 (four) hours as needed for moderate pain.  ? [DISCONTINUED] ibuprofen (ADVIL) 600 MG tablet Take 1 tablet (600 mg total) by mouth every 6 (six) hours.  ? [DISCONTINUED] Prenatal Vit-Fe Fumarate-FA (PRENATAL MULTIVITAMIN) TABS tablet Take 1 tablet by mouth at bedtime.  ? [DISCONTINUED] senna-docusate (SENOKOT-S) 8.6-50 MG tablet Take 2 tablets by mouth at bedtime as needed for mild constipation.  ? [DISCONTINUED] simethicone (MYLICON) 80 MG chewable tablet Chew 1 tablet (80 mg total) by mouth 3 (three) times daily after meals.  ? ?No facility-administered encounter medications on file as of 03/28/2021.  ?  ? ?Past Medical History:  ?Diagnosis Date  ? Anemia   ? ? ?Past Surgical History:   ?Procedure Laterality Date  ? APPENDECTOMY    ? CESAREAN SECTION N/A 05/27/2013  ? Procedure: CESAREAN SECTION;  Surgeon: Robley Fries, MD;  Location: [facility];  Service: Obstetrics;  Laterality: N/A;  ? CESAREAN SECTION N/A 07/29/2020  ? Procedure: CESAREAN SECTION;  Surgeon: Shea Evans, MD;  Location: [facility];  Service: Obstetrics;  Laterality: N/A;  ? CHOLECYSTECTOMY    ? ? ?No family history on file. ? ?Social History  ? ?Social History Narrative  ? Not on file  ?  ? ?Review of Systems ?General: Denies fevers, chills, weight loss ?CV: Denies chest pain, shortness of breath, palpitations ? ?Physical Exam ?Vitals with BMI 03/28/2021 09/09/2020 07/31/2020  ?Height 5\' 0"  - -  ?Weight 186 lbs - -  ?BMI 36.33 - -  ?Systolic 127 137  ?Diastolic 84 92 64  ?Pulse 83 70 64  ?  ?General:  No acute distress,  Alert and oriented, Non-Toxic, Normal speech and affect ?Abdomen: Abdomen is soft nontender.  Small umbilical hernia defect.  Lower transverse scar from C-sections.  Mild to moderate excess skin and adipose tissue in the infra and supraumbilical areas.  Likely has mild rectus diastases. ? ?Assessment/Plan ?Patient is a good candidate for abdominoplasty in conjunction with the umbilical hernia repair.  We discussed the surgery in detail.  We discussed risks include bleeding, infection, damage to surrounding structures need for additional procedures.  We discussed  the need for drains postoperatively.  We discussed the general postoperative expectations and recovery period.  We discussed the location and orientation of the scars.  All of her questions were answered and she is interested in moving forward. ? ?Robin Berry ?03/28/2021, 9:16 AM  ? ? ?  ?

## 2021-03-29 ENCOUNTER — Ambulatory Visit
Admission: RE | Admit: 2021-03-29 | Discharge: 2021-03-29 | Disposition: A | Discharge status: Home / Self Care | Payer: BC Managed Care – PPO | Source: Ambulatory Visit | Attending: Surgery | Admitting: Surgery

## 2021-03-29 DIAGNOSIS — K432 Incisional hernia without obstruction or gangrene: Secondary | ICD-10-CM

## 2021-03-29 MED ORDER — IOPAMIDOL (ISOVUE-300) INJECTION 61%
100.0000 mL | Freq: Once | INTRAVENOUS | Status: AC | PRN
  Administered 2021-03-29: 100 mL via INTRAVENOUS

## 2021-04-24 ENCOUNTER — Telehealth: Payer: Self-pay | Admitting: Plastic Surgery

## 2021-04-24 NOTE — Telephone Encounter (Signed)
Returned patient's call. Patient wanted to find out if she needed to coordinate the date between two offices or if we took care of that. Patient had questions regarding financial aspects of surgery. Patient has follow up visit with Dr. Thermon Leyland on 4/13. Patient is aware we will coordinate after that day. All of patients questions were answered but she knows to call if she has more.  ?

## 2021-06-05 ENCOUNTER — Telehealth: Payer: Self-pay | Admitting: Plastic Surgery

## 2021-06-05 NOTE — Telephone Encounter (Signed)
Attempted to call patient to schedule pre and post op appts, phone just rang out. Will send mychart message. ?

## 2021-06-22 ENCOUNTER — Encounter: Payer: Self-pay | Admitting: Plastic Surgery

## 2021-07-30 ENCOUNTER — Telehealth: Payer: Self-pay | Admitting: Plastic Surgery

## 2021-07-30 NOTE — Telephone Encounter (Signed)
Spoke with patient about 7/31 surgery.  She would like to move ahead and can meet me 7/18 at her preop.

## 2021-08-07 ENCOUNTER — Ambulatory Visit (INDEPENDENT_AMBULATORY_CARE_PROVIDER_SITE_OTHER): Payer: Self-pay | Admitting: Student

## 2021-08-07 ENCOUNTER — Encounter: Payer: Self-pay | Admitting: Student

## 2021-08-07 VITALS — BP 124/80 | HR 77 | Ht 60.0 in | Wt 178.0 lb

## 2021-08-07 DIAGNOSIS — Z411 Encounter for cosmetic surgery: Secondary | ICD-10-CM

## 2021-08-07 MED ORDER — ONDANSETRON HCL 4 MG PO TABS: 4.0000 mg | ORAL_TABLET | Freq: Three times a day (TID) | ORAL | 0 refills | Status: DC | PRN

## 2021-08-07 MED ORDER — CEPHALEXIN 500 MG PO CAPS: 500.0000 mg | ORAL_CAPSULE | Freq: Four times a day (QID) | ORAL | 0 refills | Status: AC

## 2021-08-07 MED ORDER — OXYCODONE HCL 5 MG PO TABS: 5.0000 mg | ORAL_TABLET | Freq: Three times a day (TID) | ORAL | 0 refills | Status: DC | PRN

## 2021-08-07 NOTE — Progress Notes (Signed)
Patient ID: Robin Berry, female    DOB: [birth year], 31 y.o.   MRN: [id number]  Chief Complaint  Patient presents with   Pre-op Exam      ICD-10-CM   1. Encounter for cosmetic procedure  Z41.1        History of Present Illness: Robin Berry is a 31 y.o.  female  who presents for preoperative evaluation for upcoming procedure, Abdominoplasty with liposuction, scheduled for 08/20/2021 with Dr. Ulice Bold.  Patient will also undergo umbilical hernia repair with Dr. Christain Sacramento at the same time.  The patient has not had problems with anesthesia.  Patient denies any history of cardiac disease.  She denies being on a blood thinner.  Patient states she is not a smoker.  Patient denies being on any hormone birth control or hormone replacement.  Patient denies any history of miscarriages.  Patient denies any personal or family history of blood clot or clotting diseases.  Patient denies any recent traumas, surgeries, serious infections, strokes or heart attacks.  She denies any history of inflammatory bowel disease or lung disease.  She denies any history of cancer.  Patient denies any recent fevers, chills or shortness of breath.  Summary of Previous Visit: Patient was seen in the clinic on 03/28/2021 by Dr. Arita Miss.  Patient reported she was bothered by the excess skin and overall abdominal contour.  Patient was found to be a good candidate for abdominoplasty in conjunction with the umbilical hernia repair.  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  PMH Significant for: Anemia  Patient reports she had a hemoglobin completed approximately 1 year ago.  She states this was normal.  Per patient's chart, her hemoglobin was 9.1.  Past Medical History: Allergies: Allergies  Allergen Reactions   Lemon Flavor Shortness Of Breath and Swelling    Mouth and throat  swelling     Current Medications:  Current Outpatient Medications:    acetaminophen (TYLENOL) 500 MG tablet, Take 2 tablets (1,000 mg total) by mouth every 6 (six) hours., Disp: 30 tablet, Rfl: 0   cephALEXin (KEFLEX) 500 MG capsule, Take 1 capsule (500 mg total) by mouth 4 (four) times daily for 3 days., Disp: 12 capsule, Rfl: 0   coconut oil OIL, Apply 1 application topically as needed., Disp: , Rfl: 0   ondansetron (ZOFRAN) 4 MG tablet, Take 1 tablet (4 mg total) by mouth every 8 (eight) hours as needed for up to 20 doses for nausea or vomiting., Disp: 20 tablet, Rfl: 0   oxyCODONE (ROXICODONE) 5 MG immediate release tablet, Take 1 tablet (5 mg total) by mouth every 8 (eight) hours as needed for up to 20 doses for severe pain., Disp: 20 tablet, Rfl: 0   ferrous sulfate 325 (65 FE) MG tablet, Take 1 tablet (325 mg total) by mouth daily., Disp: 30 tablet, Rfl: 3  Past Medical Problems: Past Medical History:  Diagnosis Date   Anemia     Past Surgical History: Past Surgical History:  Procedure Laterality Date   APPENDECTOMY     CESAREAN SECTION N/A 05/27/2013   Procedure: CESAREAN SECTION;  Surgeon: Robley Fries, MD;  Location: [facility];  Service: Obstetrics;  Laterality: N/A;   CESAREAN SECTION N/A 07/29/2020   Procedure: CESAREAN SECTION;  Surgeon: Shea Evans, MD;  Location: [facility];  Service: Obstetrics;  Laterality: N/A;  CHOLECYSTECTOMY      Social History: Social History   Socioeconomic History   Marital status: Married    Spouse name: Not on file   Number of children: Not on file   Years of education: Not on file   Highest education level: Not on file  Occupational History   Not on file  Tobacco Use   Smoking status: Never   Smokeless tobacco: Never  Substance and Sexual Activity   Alcohol use: No   Drug use: Not on file   Sexual activity: Yes    Birth control/protection: Pill  Other Topics Concern   Not on file  Social History Narrative   Not on file    Social Determinants of Health   Financial Resource Strain: Not on file  Food Insecurity: Not on file  Transportation Needs: Not on file  Physical Activity: Not on file  Stress: Not on file  Social Connections: Not on file  Intimate Partner Violence: Not on file    Family History: No family history on file.  Review of Systems: Denies Fevers, chills, SOB  Physical Exam: Vital Signs BP 124/80 (BP Location: Left Arm, Patient Position: Sitting, Cuff Size: Normal)   Pulse 77   Ht 5' (1.524 m)   Wt 178 lb (80.7 kg)   SpO2 97%   BMI 34.76 kg/m   Physical Exam  Constitutional:      General: Not in acute distress.    Appearance: Normal appearance. Not ill-appearing.  HENT:     Head: Normocephalic and atraumatic.  Eyes:     Pupils: Pupils are equal, round Neck:     Musculoskeletal: Normal range of motion.  Cardiovascular:     Rate and Rhythm: Normal rate Pulmonary:     Effort: Pulmonary effort is normal. No respiratory distress.  Abdominal:     General: Abdomen is flat. There is no distension.  Musculoskeletal: Normal range of motion.  Lower extremities: No varicose veins noted to bilateral lower extremities, none swollen legs Skin:    General: Skin is warm and dry.     Findings: No erythema or rash.  Neurological:     Mental Status: Alert and oriented to person, place, and time. Mental status is at baseline.  Psychiatric:        Mood and Affect: Mood normal.        Behavior: Behavior normal.    Assessment/Plan: The patient is scheduled for abdominoplasty with liposuction with Dr. Ulice Bold.  Risks, benefits, and alternatives of procedure discussed, questions answered and consent obtained.    Smoking Status: Non-smoker; Counseling Given?  N/A  Caprini Score: 3; Risk Factors include: BMI greater then 25, and length of planned surgery. Recommendation for mechanical  pharmacological prophylaxis. Encourage early ambulation.   Pictures obtained: We will plan to get  photos morning of surgery  Post-op Rx sent to pharmacy: Oxycodone, Zofran, Keflex  I discussed with the patient to hold her multivitamin and any herbal teas 1 week prior to surgery.  Patient expressed understanding.  Patient was provided with the Abdominoplasty and General Surgical Risk consent document and Pain Medication Agreement prior to their appointment.  They had adequate time to read through the risk consent documents and Pain Medication Agreement. We also discussed them in person together during this preop appointment. All of their questions were answered to their satisfaction.  Recommended calling if they have any further questions.  Risk consent form and Pain Medication Agreement to be scanned into patient's chart.  The risk that  can be encountered for this procedure were discussed and include the following but not limited to these: asymmetry, fluid accumulation, firmness of the tissue, skin loss, decrease or no sensation, fat necrosis, bleeding, infection, healing delay.  Deep vein thrombosis, cardiac and pulmonary complications are risks to any procedure.  There are risks of anesthesia, changes to skin sensation and injury to nerves or blood vessels.  The muscle can be temporarily or permanently injured.  You may have an allergic reaction to tape, suture, glue, blood products which can result in skin discoloration, swelling, pain, skin lesions, poor healing.  Any of these can lead to the need for revisonal surgery or stage procedures.  Weight gain and weigh loss can also effect the long term appearance. The results are not guaranteed to last a lifetime.  Future surgery may be required.    The risks that can be encountered with and after liposuction were discussed and include the following but no limited to these:  Asymmetry, fluid accumulation, firmness of the area, fat necrosis with death of fat tissue, bleeding, infection, delayed healing, anesthesia risks, skin sensation changes, injury to  structures including nerves, blood vessels, and muscles which may be temporary or permanent, allergies to tape, suture materials and glues, blood products, topical preparations or injected agents, skin and contour irregularities, skin discoloration and swelling, deep vein thrombosis, cardiac and pulmonary complications, pain, which may persist, persistent pain, recurrence of the lesion, poor healing of the incision, possible need for revisional surgery or staged procedures. Thiere can also be persistent swelling, poor wound healing, rippling or loose skin, worsening of cellulite, swelling, and thermal burn or heat injury from ultrasound with the ultrasound-assisted lipoplasty technique. Any change in weight fluctuations can alter the outcome.    Electronically signed by: Laurena Spies, PA-C 08/07/2021 5:17 PM

## 2021-08-08 ENCOUNTER — Encounter: Payer: BC Managed Care – PPO | Admitting: Surgical

## 2021-08-14 ENCOUNTER — Telehealth: Payer: Self-pay | Admitting: Plastic Surgery

## 2021-08-14 NOTE — Telephone Encounter (Signed)
Patient called to check on status of meds needed; states prescriptions not sent to pharmacy yet. Surgery scheduled for 7/31.  Pharmacy confirmed as  [id number] [place], [place] - [address] AT [facility] OF [address] & [facility]  [address] [place] [address]  Phone:  [phone]  Fax:  [phone]  DEA #:  [id number]  Please advise at [phone].

## 2021-08-15 ENCOUNTER — Other Ambulatory Visit: Payer: Self-pay | Admitting: Student

## 2021-08-15 MED ORDER — OXYCODONE HCL 5 MG PO TABS: 5.0000 mg | ORAL_TABLET | Freq: Three times a day (TID) | ORAL | 0 refills | Status: DC | PRN

## 2021-08-15 NOTE — Progress Notes (Signed)
Patient did not receive pain medications from the pharmacy. Confirmed she did not pick them up with the pharmacy and on [place] PMP Aware. Will resend prescription.

## 2021-08-20 DIAGNOSIS — Z411 Encounter for cosmetic surgery: Secondary | ICD-10-CM

## 2021-08-22 ENCOUNTER — Telehealth: Payer: Self-pay | Admitting: Student

## 2021-08-22 NOTE — Telephone Encounter (Signed)
Called patient today to see how she was doing.  Patient reports she is doing all right.  She states that she does have some pain, but it is controlled with medications.  Patient reports she is eating and drinking water.  She states that she has voided.  Patient reports she is constipated.  She states she has taken over-the-counter MiraLAX.  Patient states she has been passing gas.  Patient has no concerns at this time.  All questions were answered.  Instructed patient to call back if she has any questions or concerns.

## 2021-08-29 ENCOUNTER — Encounter: Payer: BC Managed Care – PPO | Admitting: Surgical

## 2021-08-31 ENCOUNTER — Ambulatory Visit (INDEPENDENT_AMBULATORY_CARE_PROVIDER_SITE_OTHER): Payer: BC Managed Care – PPO | Admitting: Plastic Surgery

## 2021-08-31 ENCOUNTER — Encounter: Payer: Self-pay | Admitting: Plastic Surgery

## 2021-08-31 DIAGNOSIS — Z719 Counseling, unspecified: Secondary | ICD-10-CM

## 2021-08-31 NOTE — Progress Notes (Signed)
   Subjective:    Patient ID: Robin Berry, female    DOB: January 01, 1991, 31 y.o.   MRN: [id number]  The patient is a 31 year old female here for follow-up after undergoing abdominoplasty.  Overall she is doing really well.  Her bowels are moving and she is using the MiraLAX.  Her dressing is a little tattered so I went ahead and removed the dressing and put on a new silicone border dressing.  No sign of hematoma or seroma.  Drain output has been as expected.      Review of Systems  Constitutional: Negative.   Eyes: Negative.   Respiratory: Negative.    Cardiovascular: Negative.   Endocrine: Negative.        Objective:   Physical Exam Cardiovascular:     Rate and Rhythm: Normal rate.     Pulses: Normal pulses.  Pulmonary:     Effort: Pulmonary effort is normal.  Neurological:     Mental Status: She is alert and oriented to person, place, and time.  Psychiatric:        Mood and Affect: Mood normal.        Behavior: Behavior normal.        Thought Content: Thought content normal.         Assessment & Plan:     ICD-10-CM   1. Encounter for counseling  Z71.9        Continue with binder or the spanks whichever feels more comfortable.  We will plan to remove the left drain next week.  I removed the right drain today.

## 2021-09-03 ENCOUNTER — Ambulatory Visit (INDEPENDENT_AMBULATORY_CARE_PROVIDER_SITE_OTHER): Payer: Self-pay | Admitting: Physician Assistant

## 2021-09-03 VITALS — Temp 97.9°F

## 2021-09-03 DIAGNOSIS — L987 Excessive and redundant skin and subcutaneous tissue: Secondary | ICD-10-CM

## 2021-09-03 NOTE — Progress Notes (Signed)
This is a pleasant 31 year old female seen in our office for follow-up evaluation status post abdominoplasty by Dr. Ulice Bold on 08/20/2021.  Postoperatively patient has done very well.  She was most recently seen in our office on 08/31/2021 by Dr. Ulice Bold.  At that time she did have 1 drain removed.  She notes since that visit the drain has become more painful and would like it taken out.  She notes that over the last 2 days there has been approximately 5 cc of serous drainage.  She notes that yesterday she felt feverish but denies any fever today, denies any surrounding redness or signs of infection.  On exam she is well-appearing in no acute distress, her abdominal incision is clean dry and intact with very small wound along the right lateral incision this is superficial with no discharge or surrounding redness.  Her abdomen is soft with no fluid collections.  The left drain site has no surrounding redness or discharge, her drain has very minimal serous output.  I did remove the left-sided drain today given the patient's level pain and minimal drain output.  She tolerated this very well.  She was given wound care instructions and will follow-up in our clinic in 1 week for repeat evaluation or sooner as needed.  The patient verbalized understanding and agreement to this plan had no further questions or concerns.  A chaperone was present throughout the entire encounter.

## 2021-09-06 ENCOUNTER — Encounter: Payer: BC Managed Care – PPO | Admitting: Physician Assistant

## 2021-09-10 ENCOUNTER — Encounter: Payer: Self-pay | Admitting: Physician Assistant

## 2021-09-10 ENCOUNTER — Ambulatory Visit (INDEPENDENT_AMBULATORY_CARE_PROVIDER_SITE_OTHER): Payer: Self-pay | Admitting: Physician Assistant

## 2021-09-10 DIAGNOSIS — L987 Excessive and redundant skin and subcutaneous tissue: Secondary | ICD-10-CM

## 2021-09-10 NOTE — Progress Notes (Signed)
This is a pleasant 31 year old female seen in our office for follow-up evaluation status post abdominoplasty by Dr. Ulice Bold on 08/20/2021.  Postoperatively the patient has done well without any notable issues or complications.  She was most recently seen in our office on 09/03/2021 by myself.  At that time I removed her final drain, she tolerated this without difficulty.  Since her last office visit she notes that she has been doing well.  She denies any severe pain, notes some minimal discomfort along the lower abdomen that is improving that requires only over-the-counter medications.  She notes some minimal fullness in her mid abdomen but denies any focal swelling or fluid collections.  She denies any infectious etiology.  She denies any issues with her incision.  On exam she is well-appearing in no acute distress, her abdomen is soft nontender with no fluid collections.  She has a very small area of firmness in the right lower abdomen measuring approximately 1 x 1 cm.  Her abdominal incision is clean dry and intact with routine healing.  Several remaining dissolvable's sutures were removed.  Overall the patient has been doing very well, she continues to wear her compressive garments.  At this time I do feel 1 last follow-up appointment will be warranted, we will have the patient follow-up in 2 weeks for repeat evaluation.  At that time if the patient has no significant issues or concerns that will be her last visit.  I did advise the patient that if she feels she is doing very well and has no questions or concerns that she may cancel that appointment.  I did obtain photos today.  A chaperone was present for the entire exam.

## 2021-09-25 ENCOUNTER — Encounter: Payer: Self-pay | Admitting: Physician Assistant

## 2021-09-25 ENCOUNTER — Ambulatory Visit (INDEPENDENT_AMBULATORY_CARE_PROVIDER_SITE_OTHER): Payer: Self-pay | Admitting: Physician Assistant

## 2021-09-25 DIAGNOSIS — Z411 Encounter for cosmetic surgery: Secondary | ICD-10-CM

## 2021-09-25 NOTE — Progress Notes (Signed)
This is a pleasant 31 year old female seen in our office for follow-up evaluation status post abdominoplasty by Dr. Ulice Bold on 08/20/2021.  Postoperatively patient has done well with no significant issues or complications.  She was most recently seen in the office on 09/10/2021.  Since her last office visit she denies any issues or complaints.  She denies any significant abdominal pain, fluid collections, fever or chills.  She does note some nausea but no vomiting.  Chaperone present for exam.  On exam she is well-appearing in no acute distress, her abdomen is soft nontender with no fluid collections.  Her abdominal incision is clean dry and intact with routine healing.  Overall the patient is doing very well she is approximately 6 weeks postop at this time.  Her incisions have healed with no issues.  I did discuss scar creams.  At this time she may gradually increase her activity level, she does not require further evaluation or management, she may follow-up as needed.  She verbalized understanding and agreement to today's plan had no further questions or concerns.

## 2022-05-14 ENCOUNTER — Other Ambulatory Visit: Payer: Self-pay | Admitting: Obstetrics & Gynecology

## 2022-05-14 DIAGNOSIS — R1011 Right upper quadrant pain: Secondary | ICD-10-CM

## 2022-06-04 ENCOUNTER — Other Ambulatory Visit: Payer: BC Managed Care – PPO

## 2022-06-18 ENCOUNTER — Ambulatory Visit
Admission: RE | Admit: 2022-06-18 | Discharge: 2022-06-18 | Disposition: A | Discharge status: Home / Self Care | Payer: BC Managed Care – PPO | Source: Ambulatory Visit | Attending: Obstetrics & Gynecology | Admitting: Obstetrics & Gynecology

## 2022-06-18 DIAGNOSIS — R1011 Right upper quadrant pain: Secondary | ICD-10-CM

## 2022-11-29 ENCOUNTER — Ambulatory Visit: Payer: BC Managed Care – PPO | Admitting: Family Medicine

## 2022-11-29 ENCOUNTER — Encounter: Payer: Self-pay | Admitting: Family Medicine

## 2022-11-29 VITALS — BP 122/82 | HR 75 | Temp 98.4°F | Ht 60.0 in | Wt 176.3 lb

## 2022-11-29 DIAGNOSIS — Z23 Encounter for immunization: Secondary | ICD-10-CM

## 2022-11-29 DIAGNOSIS — K5909 Other constipation: Secondary | ICD-10-CM | POA: Insufficient documentation

## 2022-11-29 LAB — COMPREHENSIVE METABOLIC PANEL
ALT: 17 U/L (ref 0–35)
AST: 15 U/L (ref 0–37)
Albumin: 4.4 g/dL (ref 3.5–5.2)
Alkaline Phosphatase: 42 U/L (ref 39–117)
BUN: 8 mg/dL (ref 6–23)
CO2: 29 meq/L (ref 19–32)
Calcium: 9.6 mg/dL (ref 8.4–10.5)
Chloride: 104 meq/L (ref 96–112)
Creatinine, Ser: 0.82 mg/dL (ref 0.40–1.20)
GFR: 93.61 mL/min (ref >60.00)
Glucose, Bld: 75 mg/dL (ref 70–99)
Potassium: 4.2 meq/L (ref 3.5–5.1)
Sodium: 139 meq/L (ref 135–145)
Total Bilirubin: 0.5 mg/dL (ref 0.2–1.2)
Total Protein: 8 g/dL (ref 6.0–8.3)

## 2022-11-29 LAB — CBC WITH DIFFERENTIAL/PLATELET
Basophils Absolute: 0 10*3/uL (ref 0.0–0.1)
Basophils Relative: 0.3 % (ref 0.0–3.0)
Eosinophils Absolute: 0.2 10*3/uL (ref 0.0–0.7)
Eosinophils Relative: 2.7 % (ref 0.0–5.0)
HCT: 38.2 % (ref 36.0–46.0)
Hemoglobin: 12.4 g/dL (ref 12.0–15.0)
Lymphocytes Relative: 36.8 % (ref 12.0–46.0)
Lymphs Abs: 2.6 10*3/uL (ref 0.7–4.0)
MCHC: 32.4 g/dL (ref 30.0–36.0)
MCV: 92.8 fL (ref 78.0–100.0)
Monocytes Absolute: 0.4 10*3/uL (ref 0.1–1.0)
Monocytes Relative: 5.6 % (ref 3.0–12.0)
Neutro Abs: 3.9 10*3/uL (ref 1.4–7.7)
Neutrophils Relative %: 54.6 % (ref 43.0–77.0)
Platelets: 297 10*3/uL (ref 150.0–400.0)
RBC: 4.11 Mil/uL (ref 3.87–5.11)
RDW: 12.5 % (ref 11.5–15.5)
WBC: 7.1 10*3/uL (ref 4.0–10.5)

## 2022-11-29 LAB — TSH: TSH: 1.06 u[IU]/mL (ref 0.35–5.50)

## 2022-11-29 NOTE — Patient Instructions (Addendum)
Miralax 1 scoop daily  Lactobacillius acidophillus-- take with each meal  Fiber- at least 35 grams per day  Increasing hydration-- 64 ounces at least per day  2 tablets of senokot S-- every 3-4 days as needed if no BM

## 2022-11-29 NOTE — Progress Notes (Unsigned)
New Patient Office Visit  Subjective    Patient ID: Robin Berry, female    DOB: Dec 26, 1990  Age: 32 y.o. MRN: [id number]  CC:  Chief Complaint  Patient presents with   Establish Care    HPI Robin Berry presents to establish care Pt is here to report chronic right sided abdominal pain that has been going on for about 2 years. States that she doesn't think there was an acute injury, states that it consistently happens towards the end of the day and in the morning is when it is worse. She treats with tylenol and rest which does help with the pain. She does report struggling with constipation, states that she went to a specialist, had CT scan done.   Pt describes the pain as sharp, constant, throbbing, comes and goes, in the right lower quadrant, the pain is not associated with food, not associated with urination, or her periods. She does see her GYN regularly, has not been diagnosed with endometriosis or other GYN issues.   Pt reports she takes miralax every day for the past 3 months. States that it isn't really helping as much as she would like, she only uses the bathroom about once a week, has gone up to 2 weeks without a BM at one point.   Current Outpatient Medications  Medication Instructions   acetaminophen (TYLENOL) 1,000 mg, Oral, Every 6 hours   Multiple Vitamin (MULTIVITAMIN ADULT PO) Oral, Daily   norethindrone-ethinyl estradiol-FE (BLISOVI FE 1/20) 1-20 MG-MCG tablet 1 tablet, Oral, Daily   polyethylene glycol (MIRALAX / GLYCOLAX) 17 g, Oral, Daily    Past Medical History:  Diagnosis Date   Anemia    Heart murmur    Ovarian cyst     Past Surgical History:  Procedure Laterality Date   APPENDECTOMY     CESAREAN SECTION N/A 05/27/2013   Procedure: CESAREAN SECTION;  Surgeon: Robley Fries, MD;  Location: [facility];  Service: Obstetrics;  Laterality: N/A;   CESAREAN SECTION N/A 07/29/2020   Procedure: CESAREAN SECTION;  Surgeon:  Shea Evans, MD;  Location: [facility];  Service: Obstetrics;  Laterality: N/A;   CHOLECYSTECTOMY     HERNIA REPAIR     [id number]    Family History  Problem Relation Age of Onset   Asthma Daughter    Hypertension Maternal Grandmother     Social History   Socioeconomic History   Marital status: Married    Spouse name: Not on file   Number of children: Not on file   Years of education: Not on file   Highest education level: Not on file  Occupational History   Not on file  Tobacco Use   Smoking status: Never   Smokeless tobacco: Never  Substance and Sexual Activity   Alcohol use: Never   Drug use: Never   Sexual activity: Yes    Birth control/protection: Pill  Other Topics Concern   Not on file  Social History Narrative   Not on file   Social Determinants of Health   Financial Resource Strain: Not on file  Food Insecurity: Not on file  Transportation Needs: Not on file  Physical Activity: Not on file  Stress: Not on file  Social Connections: Unknown (05/24/2021)   [facility] from [facility]   Social Network    Social Network: Not on file  Intimate Partner Violence: Unknown (04/25/2021)   [facility]   HITS    Physically Hurt:  Not on file    Insult or Talk Down To: Not on file    Threaten Physical Harm: Not on file    Scream or Curse: Not on file    Review of Systems  All other systems reviewed and are negative.       Objective    BP 122/82 (BP Location: Left Arm, Patient Position: Sitting, Cuff Size: Large)   Pulse 75   Temp 98.4 F (36.9 C) (Oral)   Ht 5' (1.524 m)   Wt 176 lb 4.8 oz (80 kg)   LMP 11/28/2022 (Exact Date)   SpO2 97%   BMI 34.43 kg/m   Physical Exam Vitals reviewed.  Constitutional:      Appearance: Normal appearance. She is well-groomed. She is obese.  Eyes:     Conjunctiva/sclera: Conjunctivae normal.  Neck:     Thyroid: No thyromegaly.  Cardiovascular:     Rate and Rhythm:  Normal rate and regular rhythm.     Pulses: Normal pulses.     Heart sounds: S1 normal and S2 normal.  Pulmonary:     Effort: Pulmonary effort is normal.     Breath sounds: Normal breath sounds and air entry.  Abdominal:     General: Bowel sounds are normal.  Musculoskeletal:     Right lower leg: No edema.     Left lower leg: No edema.  Neurological:     Mental Status: She is alert and oriented to person, place, and time. Mental status is at baseline.     Gait: Gait is intact.  Psychiatric:        Mood and Affect: Mood and affect normal.        Speech: Speech normal.        Behavior: Behavior normal.        Judgment: Judgment normal.         Assessment & Plan:  Need for immunization against influenza -     Flu vaccine trivalent PF, 6mos and older(Flulaval,Afluria,Fluarix,Fluzone)  Chronic constipation Assessment & Plan: Chronic, ongoing, I will order preliminary labs to rule out other causes, then place a referral to GI since she has failed miralax, fiber, and other OTC medications.   Orders: -     Ambulatory referral to Gastroenterology -     TSH -     Comprehensive metabolic panel -     CBC with Differential/Platelet    Return in about 4 months (around 03/29/2023) for annual physical exam.   Karie Georges, MD

## 2022-12-05 NOTE — Assessment & Plan Note (Signed)
Chronic, ongoing, I will order preliminary labs to rule out other causes, then place a referral to GI since she has failed miralax, fiber, and other OTC medications.

## 2023-02-02 ENCOUNTER — Encounter: Payer: Self-pay | Admitting: Family Medicine

## 2023-02-11 ENCOUNTER — Encounter: Payer: Self-pay | Admitting: Gastroenterology

## 2023-02-11 ENCOUNTER — Ambulatory Visit: Payer: Self-pay | Admitting: Gastroenterology

## 2023-02-11 VITALS — BP 130/86 | HR 84 | Ht 60.0 in | Wt 182.0 lb

## 2023-02-11 DIAGNOSIS — K5909 Other constipation: Secondary | ICD-10-CM

## 2023-02-11 DIAGNOSIS — R1084 Generalized abdominal pain: Secondary | ICD-10-CM

## 2023-02-11 DIAGNOSIS — R109 Unspecified abdominal pain: Secondary | ICD-10-CM

## 2023-02-11 MED ORDER — LINACLOTIDE 145 MCG PO CAPS: 145.0000 ug | ORAL_CAPSULE | Freq: Every day | ORAL | 3 refills | Status: DC

## 2023-02-11 NOTE — Progress Notes (Signed)
Chief Complaint: Acute on chronic constipation Primary GI MD: Gentry Fitz  HPI: 33 year old female with medical history as listed below presents for evaluation of acute on chronic constipation  CT abdomen pelvis with contrast 03/29/2021 for incisional hernia pain: S/p cholecystectomy.  Normal pancreas.  No evidence of bowel inflammation.  Mild anterior abdominal wall laxity with small fat-containing ventral hernia.  Bilateral renal stones.  US abdomen complete 06/18/2022 for RUQ pain shows normal liver, gallbladder surgically absent, no acute process  Normal CBC, CMP, TSH November 2024  Discussed the use of AI scribe software for clinical note transcription with the patient, who gave verbal consent to proceed.  The patient, with a history of chronic constipation, reports a worsening of symptoms over the past 10-12 months. Previously, bowel movements occurred approximately once a week or every other week. However, the frequency has decreased to once every 3-4 weeks. The patient describes associated abdominal bloating and pain, which intensifies with the prolonged absence of bowel movements. The stool consistency is described as soft but formed, and defecation often requires significant straining.  The patient denies any associated nausea, vomiting, unintentional weight loss, or rectal bleeding. There is no known family history of colon cancer. Over-the-counter laxatives have been tried with variable results, often resulting in either excessively loose stools or no effect. The patient has been taking Miralax daily for approximately a month, with a slight improvement in bowel frequency to once every 2-2.5 weeks.  In addition to constipation, the patient reports a persistent right-sided abdominal pain, which worsens towards the end of the day. The pain is relieved by Tylenol and rest, and also improves following a bowel movement. The patient denies any exacerbation of pain with movement or  eating.  The patient's medical history is significant for the birth of a child within the past year, and a recent move to a new city. The patient acknowledges these as potential stressors that may have contributed to the worsening of constipation symptoms. The patient has been making efforts to increase water intake and has been using an app to track and improve this habit.      Past Medical History:  Diagnosis Date   Anemia    Heart murmur    Ovarian cyst     Past Surgical History:  Procedure Laterality Date   APPENDECTOMY     CESAREAN SECTION N/A 05/27/2013   Procedure: CESAREAN SECTION;  Surgeon: Robley Fries, MD;  Location: [facility];  Service: Obstetrics;  Laterality: N/A;   CESAREAN SECTION N/A 07/29/2020   Procedure: CESAREAN SECTION;  Surgeon: Shea Evans, MD;  Location: [facility];  Service: Obstetrics;  Laterality: N/A;   CHOLECYSTECTOMY     HERNIA REPAIR     [id number]    Current Outpatient Medications  Medication Sig Dispense Refill   acetaminophen (TYLENOL) 500 MG tablet Take 2 tablets (1,000 mg total) by mouth every 6 (six) hours. 30 tablet 0   linaclotide (LINZESS) 145 MCG CAPS capsule Take 1 capsule (145 mcg total) by mouth daily. Take 30 minutes before breakfast. 30 capsule 3   Multiple Vitamin (MULTIVITAMIN ADULT PO) Take by mouth daily.     norethindrone-ethinyl estradiol-FE (BLISOVI FE 1/20) 1-20 MG-MCG tablet Take 1 tablet by mouth daily.     polyethylene glycol (MIRALAX / GLYCOLAX) 17 g packet Take 17 g by mouth daily.     No current facility-administered medications for this visit.    Allergies as of 02/11/2023 - Review Complete 02/11/2023  Allergen Reaction Noted  Lemon flavoring agent (non-screening) Shortness Of Breath and Swelling 12/07/2012    Family History  Problem Relation Age of Onset   Hypertension Maternal Grandmother    Asthma Daughter    Pancreatic cancer Neg Hx    Colon cancer Neg Hx    Esophageal cancer Neg Hx    Stomach cancer Neg  Hx     Social History   Socioeconomic History   Marital status: Married    Spouse name: Not on file   Number of children: Not on file   Years of education: Not on file   Highest education level: Not on file  Occupational History   Not on file  Tobacco Use   Smoking status: Never   Smokeless tobacco: Never  Vaping Use   Vaping status: Never Used  Substance and Sexual Activity   Alcohol use: Never   Drug use: Never   Sexual activity: Yes    Birth control/protection: Pill  Other Topics Concern   Not on file  Social History Narrative   Not on file   Social Drivers of Health   Financial Resource Strain: Not on file  Food Insecurity: Not on file  Transportation Needs: Not on file  Physical Activity: Not on file  Stress: Not on file  Social Connections: Unknown (05/24/2021)   Received from [facility], [facility]   Social Network    Social Network: Not on file  Intimate Partner Violence: Unknown (04/25/2021)   Received from [employer], [facility]   HITS    Physically Hurt: Not on file    Insult or Talk Down To: Not on file    Threaten Physical Harm: Not on file    Scream or Curse: Not on file    Review of Systems:    Constitutional: No weight loss, fever, chills, weakness or fatigue HEENT: Eyes: No change in vision               Ears, Nose, Throat:  No change in hearing or congestion Skin: No rash or itching Cardiovascular: No chest pain, chest pressure or palpitations   Respiratory: No SOB or cough Gastrointestinal: See HPI and otherwise negative Genitourinary: No dysuria or change in urinary frequency Neurological: No headache, dizziness or syncope Musculoskeletal: No new muscle or joint pain Hematologic: No bleeding or bruising Psychiatric: No history of depression or anxiety    Physical Exam:  Vital signs: BP 130/86 (BP Location: Left Arm, Patient Position: Sitting, Cuff Size: Normal)   Pulse 84   Ht 5' (1.524 m)   Wt 182 lb (82.6 kg)   SpO2  97%   BMI 35.54 kg/m   Constitutional: NAD, Well developed, Well nourished, alert and cooperative Head:  Normocephalic and atraumatic. Eyes:   PEERL, EOMI. No icterus. Conjunctiva pink. Respiratory: Respirations even and unlabored. Lungs clear to auscultation bilaterally.   No wheezes, crackles, or rhonchi.  Cardiovascular:  Regular rate and rhythm. No peripheral edema, cyanosis or pallor.  Gastrointestinal:  Soft, nondistended, nontender. No rebound or guarding. Normal bowel sounds. No appreciable masses or hepatomegaly. Rectal:  Not performed. Patient declined rectal exam. Msk:  Symmetrical without gross deformities. Without edema, no deformity or joint abnormality.  Neurologic:  Alert and  oriented x4;  grossly normal neurologically.  Skin:   Dry and intact without significant lesions or rashes. Psychiatric: Oriented to person, place and time. Demonstrates good judgement and reason without abnormal affect or behaviors.     RELEVANT LABS AND IMAGING: CBC    Component Value  Date/Time   WBC 7.1 11/29/2022 1004   RBC 4.11 11/29/2022 1004   HGB 12.4 11/29/2022 1004   HCT 38.2 11/29/2022 1004   PLT 297.0 11/29/2022 1004   MCV 92.8 11/29/2022 1004   MCH 30.1 07/30/2020 0503   MCHC 32.4 11/29/2022 1004   RDW 12.5 11/29/2022 1004   LYMPHSABS 2.6 11/29/2022 1004   MONOABS 0.4 11/29/2022 1004   EOSABS 0.2 11/29/2022 1004   BASOSABS 0.0 11/29/2022 1004    CMP     Component Value Date/Time   NA 139 11/29/2022 1004   K 4.2 11/29/2022 1004   CL 104 11/29/2022 1004   CO2 29 11/29/2022 1004   GLUCOSE 75 11/29/2022 1004   BUN 8 11/29/2022 1004   CREATININE 0.82 11/29/2022 1004   CALCIUM 9.6 11/29/2022 1004   PROT 8.0 11/29/2022 1004   ALBUMIN 4.4 11/29/2022 1004   AST 15 11/29/2022 1004   ALT 17 11/29/2022 1004   ALKPHOS 42 11/29/2022 1004   BILITOT 0.5 11/29/2022 1004     Assessment/Plan:      Chronic Constipation Worsening constipation over the past 10-12 months with  bowel movements every 3-4 weeks. Associated with abdominal pain and bloating. No nausea, vomiting, weight loss, or blood in stool. No family history of colon cancer. Minimal response to Miralax and probiotics.  Normal TSH.  Acute on chronic constipation likely exacerbated by recent stressors including having a child moving to a new city. --Start Linzess , with samples provided. --increase water, increase fiber, increase exercise --Consider colonoscopy if no improvement with Linzess.  Patient is currently hesitant towards colonoscopy.  Educated patient on colonoscopy and provided patient education handout.  She would like to further discuss this at follow-up  Right-sided Abdominal Pain Daily right-sided abdominal pain, worse towards the end of the day, improves with Tylenol and rest. Possibly related to constipation vs musculoskeletal in nature.  Previous RUQ ultrasound and CT were unrevealing. --Continue Tylenol as needed for pain relief. --Address constipation which may improve pain.  Follow-up in 8 weeks to assess response to Linzess and ongoing management. Patient to call if not feeling well before scheduled follow-up.     Assigned to Dr. Myrtie Neither this morning.  Lara Mulch [facility] 02/11/2023, 9:30 AM  Cc: Karie Georges, MD

## 2023-02-11 NOTE — Patient Instructions (Addendum)
Linzess 145 mcg- take daily, 30 minutes before breakfast  Follow up in 8 weeks.  Due to recent changes in healthcare laws, you may see the results of your imaging and laboratory studies on MyChart before your provider has had a chance to review them.  We understand that in some cases there may be results that are confusing or concerning to you. Not all laboratory results come back in the same time frame and the provider may be waiting for multiple results in order to interpret others.  Please give [place] 48 hours in order for your provider to thoroughly review all the results before contacting the office for clarification of your results.   Thank you for trusting me with your gastrointestinal care!   Boone Master, PA

## 2023-02-12 ENCOUNTER — Telehealth: Payer: Self-pay | Admitting: *Deleted

## 2023-02-12 DIAGNOSIS — K5909 Other constipation: Secondary | ICD-10-CM

## 2023-02-12 DIAGNOSIS — R1084 Generalized abdominal pain: Secondary | ICD-10-CM

## 2023-02-12 NOTE — Progress Notes (Signed)
____________________________________________________________  Attending physician addendum:  Thank you for sending this case to me. I have reviewed the entire note and agree with the plan.  Although she had a CT scan in 2023, this pain and constipation are reportedly new or worsened since then. My opinion is that a CTAP should be obtained at this point.  Amada Jupiter, MD  ____________________________________________________________

## 2023-02-12 NOTE — Telephone Encounter (Signed)
Orders placed for CT at [facility]. I have spoken to patient and have advised of this information. Advised that [facility] should reach out to her to get her scheduled, however, if they do not, she has been given [employer] number to schedule directly. She verbalizes understanding.

## 2023-02-12 NOTE — Telephone Encounter (Signed)
-----   Message from Legrand Como sent at 02/12/2023  2:55 PM EST ----- Please repeat CTAP with contrast for constipation ----- Message ----- From: Sherrilyn Rist, MD Sent: 02/12/2023   2:52 PM EST To: Legrand Como, PA-C     ----- Message ----- From: Legrand Como, PA-C Sent: 02/11/2023   9:43 AM EST To: Sherrilyn Rist, MD

## 2023-03-07 ENCOUNTER — Encounter: Payer: Self-pay | Admitting: Family Medicine

## 2023-03-07 ENCOUNTER — Ambulatory Visit: Payer: 59 | Admitting: Family Medicine

## 2023-03-07 VITALS — BP 118/78 | HR 82 | Temp 98.3°F | Ht 60.0 in | Wt 181.2 lb

## 2023-03-07 DIAGNOSIS — N644 Mastodynia: Secondary | ICD-10-CM

## 2023-03-07 NOTE — Progress Notes (Signed)
   Acute Office Visit  Subjective:     Patient ID: Robin Berry, female    DOB: 03-Mar-1990, 33 y.o.   MRN: [id number]  Chief Complaint  Patient presents with   Breast Pain    Patient complains of right breast pain x1 week, denies drainage or other symptoms, "feels like mastitis she had while breastfeeding", used Tylenol     HPI Patient is in today for right side breast pain, started about 1 weeks ago, she reports she hasn't felt any lumps or bumps that she can tell, no fever or chills. States that when she was breastfeeding she did have a case of mastitis and it kind of feels like that, is not currently breast feeding or pregnant, just finished her period 1 week ago.   Review of Systems  All other systems reviewed and are negative.       Objective:    BP 118/78   Pulse 82   Temp 98.3 F (36.8 C) (Oral)   Ht 5' (1.524 m)   Wt 181 lb 3.2 oz (82.2 kg)   LMP 02/24/2023 (Exact Date)   SpO2 99%   BMI 35.39 kg/m    Physical Exam Vitals reviewed.  Chest:     Chest wall: No mass, swelling, tenderness or edema.  Breasts:    Right: Tenderness (outer quadrants) present. No mass, nipple discharge or skin change.     Left: Normal. No mass, nipple discharge or skin change.     No results found for any visits on 03/07/23.      Assessment & Plan:   Problem List Items Addressed This Visit   None Visit Diagnoses       Breast pain, right    -  Primary   Relevant Orders   MM 3D DIAGNOSTIC MAMMOGRAM UNILATERAL RIGHT BREAST   US BREAST COMPLETE UNI RIGHT INC AXILLA     Exam benign, since this is non cyclical I am recommending imaging studies to rule out breast cysts, ectasia, and neoplasm. Pt is agreeable to the plan. Orders placed.   No orders of the defined types were placed in this encounter.   No follow-ups on file.  Karie Georges, MD

## 2023-03-10 ENCOUNTER — Other Ambulatory Visit: Payer: Self-pay

## 2023-03-22 LAB — LAB REPORT - SCANNED
A1c: 4.8
EGFR (African American): 80
Free T4: 0.99 ng/dL
TSH: 2.08 (ref 0.41–5.90)

## 2023-03-28 ENCOUNTER — Ambulatory Visit (INDEPENDENT_AMBULATORY_CARE_PROVIDER_SITE_OTHER): Payer: BC Managed Care – PPO | Admitting: Family Medicine

## 2023-03-28 ENCOUNTER — Encounter: Payer: Self-pay | Admitting: Family Medicine

## 2023-03-28 VITALS — BP 100/68 | HR 85 | Temp 98.3°F | Ht 61.0 in | Wt 181.6 lb

## 2023-03-28 DIAGNOSIS — E559 Vitamin D deficiency, unspecified: Secondary | ICD-10-CM

## 2023-03-28 DIAGNOSIS — Z0000 Encounter for general adult medical examination without abnormal findings: Secondary | ICD-10-CM

## 2023-03-28 MED ORDER — VITAMIN D (ERGOCALCIFEROL) 1.25 MG (50000 UNIT) PO CAPS: 50000.0000 [IU] | ORAL_CAPSULE | ORAL | 1 refills | Status: AC

## 2023-03-28 NOTE — Progress Notes (Signed)
 Complete physical exam  Patient: Robin Berry   DOB: 1990/05/17   33 y.o. Female  MRN: [id number]  Subjective:    Chief Complaint  Patient presents with   Annual Exam    Robin Berry is a 33 y.o. female who presents today for a complete physical exam. She reports consuming a general diet. Home exercise routine includes walking 3 hrs per week. She generally feels well. She reports sleeping somewhat poorly, is having random nighttime awakenings. She does not have additional problems to discuss today.    Most recent fall risk assessment:     No data to display           Most recent depression screenings:    11/29/2022    9:21 AM  PHQ 2/9 Scores  PHQ - 2 Score 0  PHQ- 9 Score 1    Vision:Within last year and Dental: No current dental problems and Receives regular dental care  Patient Active Problem List   Diagnosis Date Noted   Chronic constipation 11/29/2022   Encounter for counseling 08/31/2021   Postpartum care following cesarean delivery (7/9) 07/31/2020   Acute on chronic anemia 07/31/2020   Status post repeat low transverse cesarean section 07/29/2020   Cesarean delivery delivered 05/28/2013      Patient Care Team: Karie Georges, MD as PCP - General (Family Medicine)   Outpatient Medications Prior to Visit  Medication Sig   acetaminophen (TYLENOL) 500 MG tablet Take 2 tablets (1,000 mg total) by mouth every 6 (six) hours.   linaclotide (LINZESS) 145 MCG CAPS capsule Take 1 capsule (145 mcg total) by mouth daily. Take 30 minutes before breakfast.   loratadine (CLARITIN) 10 MG tablet Take 10 mg by mouth daily.   Multiple Vitamin (MULTIVITAMIN ADULT PO) Take by mouth daily.   norethindrone-ethinyl estradiol-FE (BLISOVI FE 1/20) 1-20 MG-MCG tablet Take 1 tablet by mouth daily.   [DISCONTINUED] polyethylene glycol (MIRALAX / GLYCOLAX) 17 g packet Take 17 g by mouth daily.   No facility-administered medications prior to  visit.    Review of Systems  HENT:  Negative for hearing loss.   Eyes:  Negative for blurred vision.  Respiratory:  Negative for shortness of breath.   Cardiovascular:  Negative for chest pain.  Gastrointestinal: Negative.   Genitourinary: Negative.   Musculoskeletal:  Negative for back pain.  Neurological:  Negative for headaches.  Psychiatric/Behavioral:  Negative for depression.        Objective:     BP 100/68   Pulse 85   Temp 98.3 F (36.8 C) (Oral)   Ht 5\' 1"  (1.549 m)   Wt 181 lb 9.6 oz (82.4 kg)   LMP 03/21/2023 (Exact Date)   SpO2 98%   BMI 34.31 kg/m    Physical Exam Vitals reviewed.  Constitutional:      Appearance: Normal appearance. She is well-groomed. She is obese.  HENT:     Right Ear: Tympanic membrane and ear canal normal.     Left Ear: Tympanic membrane and ear canal normal.     Mouth/Throat:     Mouth: Mucous membranes are moist.     Pharynx: No posterior oropharyngeal erythema.  Eyes:     Conjunctiva/sclera: Conjunctivae normal.  Neck:     Thyroid: No thyromegaly.  Cardiovascular:     Rate and Rhythm: Normal rate and regular rhythm.     Pulses: Normal pulses.     Heart sounds: S1 normal and S2 normal.  Pulmonary:  Effort: Pulmonary effort is normal.     Breath sounds: Normal breath sounds and air entry.  Abdominal:     General: Abdomen is flat. Bowel sounds are normal.     Palpations: Abdomen is soft.  Musculoskeletal:     Right lower leg: No edema.     Left lower leg: No edema.  Lymphadenopathy:     Cervical: No cervical adenopathy.  Neurological:     Mental Status: She is alert and oriented to person, place, and time. Mental status is at baseline.     Gait: Gait is intact.  Psychiatric:        Mood and Affect: Mood and affect normal.        Speech: Speech normal.        Behavior: Behavior normal.        Judgment: Judgment normal.      No results found for any visits on 03/28/23.     Assessment & Plan:    Routine  Health Maintenance and Physical Exam  Immunization History  Administered Date(s) Administered   Influenza, Seasonal, Injecte, Preservative Fre 11/29/2022   Tdap 03/17/2013, 03/27/2021    Health Maintenance  Topic Date Due   Cervical Cancer Screening (HPV/Pap Cotest)  12/05/2018   COVID-19 Vaccine (3 - 2024-25 season) 09/22/2022   Hepatitis C Screening  11/29/2023 (Originally 12/05/2006)   DTaP/Tdap/Td (3 - Td or Tdap) 03/28/2031   INFLUENZA VACCINE  Completed   HIV Screening  Completed   HPV VACCINES  Aged Out    Discussed health benefits of physical activity, and encouraged her to engage in regular exercise appropriate for her age and condition.  Vitamin D deficiency -     Vitamin D (Ergocalciferol); Take 1 capsule (50,000 Units total) by mouth every 7 (seven) days.  Dispense: 12 capsule; Refill: 1 -     VITAMIN D 25 Hydroxy (Vit-D Deficiency, Fractures); Future  Routine general medical examination at a health care facility  Normal physical exam findings, I counseled the patient on healthy sleep habits, handouts given on healthy eating and exercise. Pt already had blood work done at a weight loss clinic, I reviewed her labs with her and will scan into chart.   Return in 1 year (on 03/27/2024) for annual physical exam.     Karie Georges, MD

## 2023-03-28 NOTE — Patient Instructions (Addendum)
 5 mg melatonin gummy right before bed  Health Maintenance, Female Adopting a healthy lifestyle and getting preventive care are important in promoting health and wellness. Ask your health care provider about: The right schedule for you to have regular tests and exams. Things you can do on your own to prevent diseases and keep yourself healthy. What should I know about diet, weight, and exercise? Eat a healthy diet  Eat a diet that includes plenty of vegetables, fruits, low-fat dairy products, and lean protein. Do not eat a lot of foods that are high in solid fats, added sugars, or sodium. Maintain a healthy weight Body mass index (BMI) is used to identify weight problems. It estimates body fat based on height and weight. Your health care provider can help determine your BMI and help you achieve or maintain a healthy weight. Get regular exercise Get regular exercise. This is one of the most important things you can do for your health. Most adults should: Exercise for at least 150 minutes each week. The exercise should increase your heart rate and make you sweat (moderate-intensity exercise). Do strengthening exercises at least twice a week. This is in addition to the moderate-intensity exercise. Spend less time sitting. Even light physical activity can be beneficial. Watch cholesterol and blood lipids Have your blood tested for lipids and cholesterol at 35 years of age, then have this test every 5 years. Have your cholesterol levels checked more often if: Your lipid or cholesterol levels are high. You are older than 35 years of age. You are at high risk for heart disease. What should I know about cancer screening? Depending on your health history and family history, you may need to have cancer screening at various ages. This may include screening for: Breast cancer. Cervical cancer. Colorectal cancer. Skin cancer. Lung cancer. What should I know about heart disease, diabetes, and high  blood pressure? Blood pressure and heart disease High blood pressure causes heart disease and increases the risk of stroke. This is more likely to develop in people who have high blood pressure readings or are overweight. Have your blood pressure checked: Every 3-5 years if you are 54-25 years of age. Every year if you are 8 years old or older. Diabetes Have regular diabetes screenings. This checks your fasting blood sugar level. Have the screening done: Once every three years after age 26 if you are at a normal weight and have a low risk for diabetes. More often and at a younger age if you are overweight or have a high risk for diabetes. What should I know about preventing infection? Hepatitis B If you have a higher risk for hepatitis B, you should be screened for this virus. Talk with your health care provider to find out if you are at risk for hepatitis B infection. Hepatitis C Testing is recommended for: Everyone born from [birth year] through 1967. Anyone with known risk factors for hepatitis C. Sexually transmitted infections (STIs) Get screened for STIs, including gonorrhea and chlamydia, if: You are sexually active and are younger than 35 years of age. You are older than 35 years of age and your health care provider tells you that you are at risk for this type of infection. Your sexual activity has changed since you were last screened, and you are at increased risk for chlamydia or gonorrhea. Ask your health care provider if you are at risk. Ask your health care provider about whether you are at high risk for HIV. Your health care provider may  recommend a prescription medicine to help prevent HIV infection. If you choose to take medicine to prevent HIV, you should first get tested for HIV. You should then be tested every 3 months for as long as you are taking the medicine. Pregnancy If you are about to stop having your period (premenopausal) and you may become pregnant, seek counseling  before you get pregnant. Take 400 to 800 micrograms (mcg) of folic acid every day if you become pregnant. Ask for birth control (contraception) if you want to prevent pregnancy. Osteoporosis and menopause Osteoporosis is a disease in which the bones lose minerals and strength with aging. This can result in bone fractures. If you are 27 years old or older, or if you are at risk for osteoporosis and fractures, ask your health care provider if you should: Be screened for bone loss. Take a calcium or vitamin D supplement to lower your risk of fractures. Be given hormone replacement therapy (HRT) to treat symptoms of menopause. Follow these instructions at home: Alcohol use Do not drink alcohol if: Your health care provider tells you not to drink. You are pregnant, may be pregnant, or are planning to become pregnant. If you drink alcohol: Limit how much you have to: 0-1 drink a day. Know how much alcohol is in your drink. In the [place] one drink equals one 12 oz bottle of beer (355 mL), one 5 oz glass of wine (148 mL), or one 1 oz glass of hard liquor (44 mL). Lifestyle Do not use any products that contain nicotine or tobacco. These products include cigarettes, chewing tobacco, and vaping devices, such as e-cigarettes. If you need help quitting, ask your health care provider. Do not use street drugs. Do not share needles. Ask your health care provider for help if you need support or information about quitting drugs. General instructions Schedule regular health, dental, and eye exams. Stay current with your vaccines. Tell your health care provider if: You often feel depressed. You have ever been abused or do not feel safe at home. Summary Adopting a healthy lifestyle and getting preventive care are important in promoting health and wellness. Follow your health care provider's instructions about healthy diet, exercising, and getting tested or screened for diseases. Follow your health care  provider's instructions on monitoring your cholesterol and blood pressure. This information is not intended to replace advice given to you by your health care provider. Make sure you discuss any questions you have with your health care provider. Document Revised: 05/29/2020 Document Reviewed: 05/29/2020 Elsevier Patient Education  2024 [employer].

## 2023-04-02 ENCOUNTER — Ambulatory Visit
Admission: RE | Admit: 2023-04-02 | Discharge: 2023-04-02 | Disposition: A | Discharge status: Home / Self Care | Payer: 59 | Source: Ambulatory Visit | Attending: Gastroenterology | Admitting: Gastroenterology

## 2023-04-02 DIAGNOSIS — R1084 Generalized abdominal pain: Secondary | ICD-10-CM

## 2023-04-02 DIAGNOSIS — K5909 Other constipation: Secondary | ICD-10-CM

## 2023-04-02 MED ORDER — IOPAMIDOL (ISOVUE-300) INJECTION 61%
500.0000 mL | Freq: Once | INTRAVENOUS | Status: AC | PRN
  Administered 2023-04-02: 100 mL via INTRAVENOUS

## 2023-04-11 ENCOUNTER — Ambulatory Visit: Payer: Self-pay | Admitting: Gastroenterology

## 2023-04-15 ENCOUNTER — Encounter: Payer: Self-pay | Admitting: Gastroenterology

## 2023-04-15 ENCOUNTER — Ambulatory Visit: Admitting: Gastroenterology

## 2023-04-15 VITALS — BP 110/80 | HR 88 | Ht 60.0 in | Wt 179.0 lb

## 2023-04-15 DIAGNOSIS — K5909 Other constipation: Secondary | ICD-10-CM

## 2023-04-15 DIAGNOSIS — R109 Unspecified abdominal pain: Secondary | ICD-10-CM | POA: Diagnosis not present

## 2023-04-15 DIAGNOSIS — K5904 Chronic idiopathic constipation: Secondary | ICD-10-CM

## 2023-04-15 NOTE — Patient Instructions (Addendum)
  _______________________________________________________  If your blood pressure at your visit was 140/90 or greater, please contact your primary care physician to follow up on this.  _______________________________________________________  If you are age 35 or older, your body mass index should be between 23-30. Your Body mass index is 34.96 kg/m. If this is out of the aforementioned range listed, please consider follow up with your Primary Care Provider.  If you are age 31 or younger, your body mass index should be between 19-25. Your Body mass index is 34.96 kg/m. If this is out of the aformentioned range listed, please consider follow up with your Primary Care Provider.   ________________________________________________________  The [facility] providers would like to encourage you to use [facility] to communicate with providers for non-urgent requests or questions.  Due to long hold times on the telephone, sending your provider a message by [facility] may be a faster and more efficient way to get a response.  Please allow 48 business hours for a response.  Please remember that this is for non-urgent requests.  _______________________________________________________ It was a pleasure to see you today!  Thank you for trusting me with your gastrointestinal care!

## 2023-04-15 NOTE — Progress Notes (Signed)
 [facility] Progress Note  Chief Complaint: Chronic constipation  Subjective  Prior history  [name] was here for an APP office visit/consultation 02/11/2023 for longstanding constipation that had worsened over the last couple years.  Clinical details in that note.  Started on Linzess 145 mcg once daily and had a CTAP report noted below   Discussed the use of AI scribe software for clinical note transcription with the patient, who gave verbal consent to proceed.  History of Present Illness Robin Berry is a 33 year old female who presents with worsening chronic constipation.  She has experienced chronic constipation for several years, with a significant worsening of symptoms over time. Bowel movements occur as infrequently as once a month, and she considers it a 'good month' if she has two bowel movements. The condition has been particularly problematic since the birth of her youngest child two years ago.  She experiences intermittent abdominal pain, which has decreased in frequency since starting Linzess, though it has not completely resolved. The pain is described as a sharp sensation on her right side, and it is less sharp and frequent than before.  Her surgical history includes multiple abdominal surgeries: three C-sections, an appendectomy, a cholecystectomy, and a hernia repair with mesh placement following the birth of her youngest child. She wonders if these surgeries might be impacting her bowel habits.  A CT scan performed a couple of years ago and a more recent ultrasound did not reveal any bleeding or other abnormalities, aside from a significant amount of stool throughout the colon.  She is currently taking Linzess 145 micrograms, which has improved her bowel movements to nearly daily, describing the change as 'completely life changing'. She will typically take it in the evening, and when she awakens early the next morning will usually have a semiformed  stool followed by 1 or 2 loose stools in quick succession before she asked that up to work. No visible bleeding in her stool.    ROS: Cardiovascular:  no chest pain Respiratory: no dyspnea  The patient's Past Medical, Family and Social History were reviewed and are on file in the EMR. Past Medical History:  Diagnosis Date   Anemia    Heart murmur    Ovarian cyst     Past Surgical History:  Procedure Laterality Date   APPENDECTOMY     CESAREAN SECTION N/A 05/27/2013   Procedure: CESAREAN SECTION;  Surgeon: Robley Fries, MD;  Location: [facility];  Service: Obstetrics;  Laterality: N/A;   CESAREAN SECTION N/A 07/29/2020   Procedure: CESAREAN SECTION;  Surgeon: Shea Evans, MD;  Location: [facility];  Service: Obstetrics;  Laterality: N/A;   CHOLECYSTECTOMY     HERNIA REPAIR     [id number]     Objective:  Med list reviewed  Current Outpatient Medications:    acetaminophen (TYLENOL) 500 MG tablet, Take 2 tablets (1,000 mg total) by mouth every 6 (six) hours., Disp: 30 tablet, Rfl: 0   linaclotide (LINZESS) 145 MCG CAPS capsule, Take 1 capsule (145 mcg total) by mouth daily. Take 30 minutes before breakfast., Disp: 30 capsule, Rfl: 3   loratadine (CLARITIN) 10 MG tablet, Take 10 mg by mouth daily., Disp: , Rfl:    Multiple Vitamin (MULTIVITAMIN ADULT PO), Take by mouth daily., Disp: , Rfl:    Semaglutide 4 MG TABS, Take by mouth once a week., Disp: , Rfl:    Vitamin D, Ergocalciferol, (DRISDOL) 1.25 MG (50000 UNIT) CAPS capsule, Take 1 capsule (  50,000 Units total) by mouth every 7 (seven) days., Disp: 12 capsule, Rfl: 1   norethindrone-ethinyl estradiol-FE (BLISOVI FE 1/20) 1-20 MG-MCG tablet, Take 1 tablet by mouth daily. (Patient not taking: Reported on 04/15/2023), Disp: , Rfl:    Vital signs in last 24 hrs: Vitals:   04/15/23 1546  BP: 110/80  Pulse: 88   Wt Readings from Last 3 Encounters:  04/15/23 179 lb (81.2 kg)  03/28/23 181 lb 9.6 oz (82.4 kg)  03/07/23 181 lb  3.2 oz (82.2 kg)    Physical Exam  Well-appearing  Cardiac: Regular without appreciable murmur,  no peripheral edema Pulm: clear to auscultation bilaterally, normal RR and effort noted Abdomen: soft, now tenderness, with active bowel sounds. No guarding or palpable hepatosplenomegaly.   Labs:   ___________________________________________ Radiologic studies: CLINICAL DATA:  Abdominal pain and constipation   EXAM: CT ABDOMEN AND PELVIS WITH CONTRAST   TECHNIQUE: Multidetector CT imaging of the abdomen and pelvis was performed using the standard protocol following bolus administration of intravenous contrast.   RADIATION DOSE REDUCTION: This exam was performed according to the departmental dose-optimization program which includes automated exposure control, adjustment of the mA and/or kV according to patient size and/or use of iterative reconstruction technique.   CONTRAST:  ISOVUE-300 IOPAMIDOL (ISOVUE-300) INJECTION 61%   COMPARISON:  Abdominal ultrasound Jun 18, 2022 CT abdomen and pelvis March 29, 2021   FINDINGS: Lower chest: No infiltrates or consolidations, no pleural effusions   Hepatobiliary: Liver normal size no masses no biliary dilatation. Prior cholecystectomy.   Pancreas: Pancreas normal size. No masses calcifications or inflammatory changes.   Spleen: Spleen normal size.  No masses.   Adrenals/Urinary Tract: Adrenal glands are normal size. Follow-up recommended. Kidneys are normal. No masses. Punctate calcifications within the right and left kidney upper, mid and lower pole calices correlate with bilateral nonobstructing kidney stones without hydronephrosis. No evidence of ureterolithiasis and there is no stones in the bladder.   Stomach/Bowel: No small or large bowel obstruction or inflammatory changes.   Moderate amount of residual fecal material throughout the colon without obstruction or constipation.   Vascular/Lymphatic: No  significant vascular findings are present. No enlarged abdominal or pelvic lymph nodes.   Reproductive: Uterus and bilateral adnexa are unremarkable. Retroverted uterus. No adnexal masses   Other: Anterior abdominal wall unremarkable without evidence of umbilical or inguinal hernias   Musculoskeletal: Visualized portion of the thoracolumbar spine and pelvic structures grossly unremarkable without evidence of fracture bony abnormalities or soft tissue masses.   IMPRESSION: *No acute findings in the abdomen or pelvis. *Moderate amount of residual fecal material throughout the colon without obstruction or constipation. *Bilateral nonobstructing kidney stones.  No hydronephrosis     Electronically Signed   By: Shaaron Adler M.D.   On: 04/02/2023 10:08    ____________________________________________ Other:   _____________________________________________   No diagnosis found.  Assessment and Plan Assessment & Plan Chronic Constipation Chronic constipation worsened over years, previously described as a BM every 2 to 3 weeks and now occurring daily with the use of Linzess.  This is most consistent with a motility issue. CT scan showed significant stool burden but otherwise without abnormality such as obstruction or mass.  Multiple prior abdominal/pelvic surgeries,, but no focal obstruction on CT scan to suggest high-grade adhesions, and the constipation began many years ago prior to the surgeries. Linzess 145 mcg improved bowel movement frequency and reduced abdominal pain. - Continue Linzess 145 mcg daily.  Abdominal Pain Intermittent sharp abdominal  pain decreased with improved bowel movements, likely due to reduced colonic distension from stool accumulation.   Refill Linzess when needed, see [place] in a year or sooner if needed.  Robin Berry

## 2023-05-13 IMAGING — CT CT ABD-PELV W/ CM
2 of 4 series · 13 of 46 positions shown, 15 images · IV contrast (agent unspecified)
Comparison: None.

CLINICAL DATA: Incisional hernia pain.  Right upper quadrant.

EXAM:
CT ABDOMEN AND PELVIS WITH CONTRAST
TECHNIQUE: Multidetector CT imaging of the abdomen and pelvis was performed
using the standard protocol following bolus administration of
intravenous contrast.

[Series 2: abd pelvis 5.00 br40 s3 axial · axial · 0.63mm/px · z∈[+1190,+1560]mm · 10 of 88 slices shown, 12 images]
[im 7/88  soft-tissue]
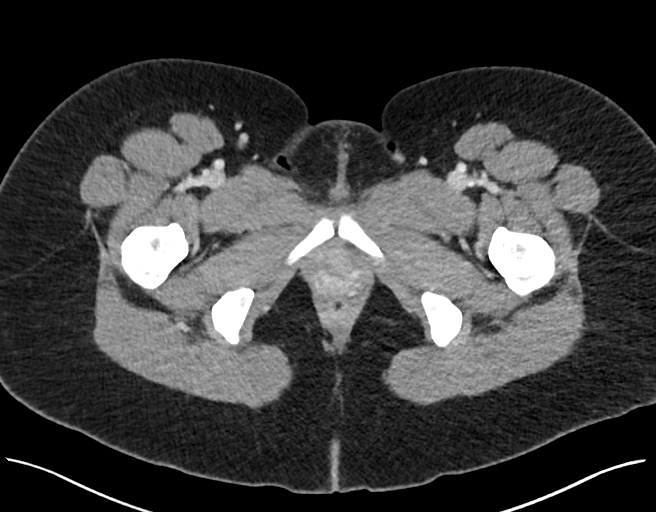
[im 7/88  bone]
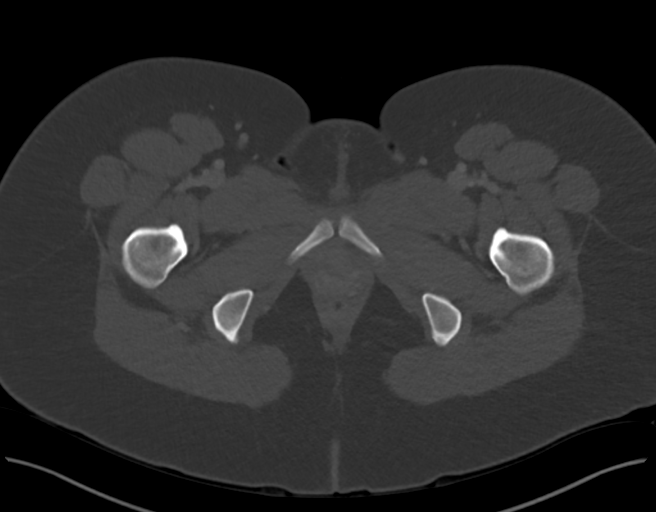
[im 14/88  soft-tissue]
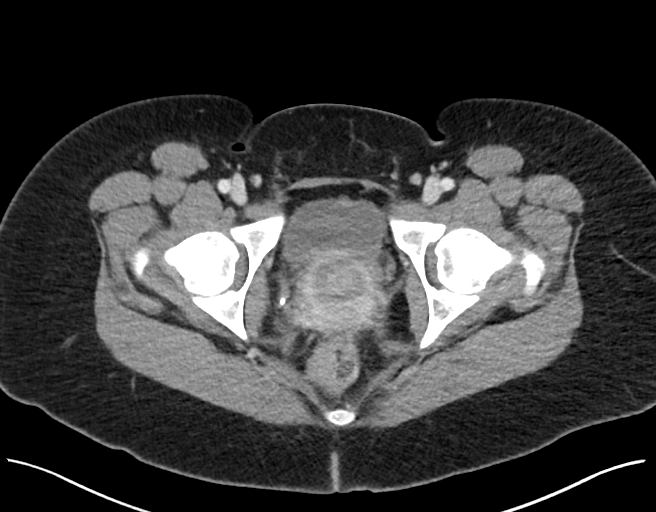
[im 25/88  soft-tissue]
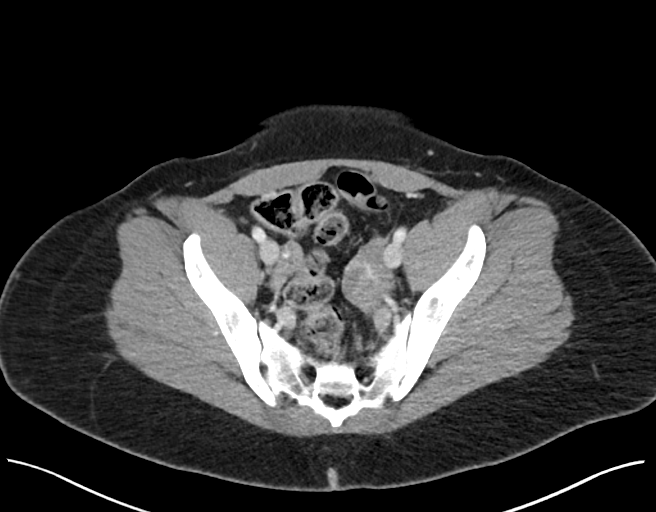
[im 32/88  soft-tissue]
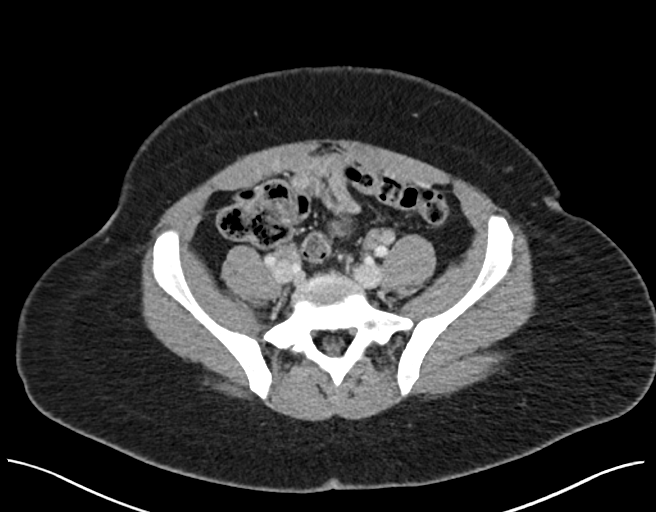
[im 39/88  soft-tissue]
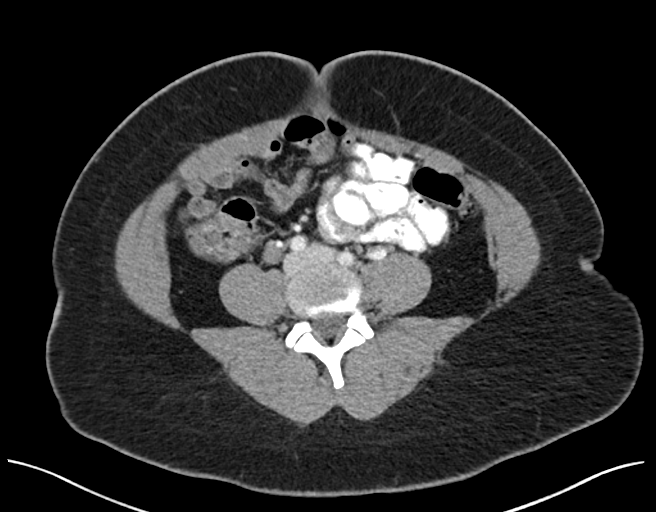
[im 49/88  soft-tissue]
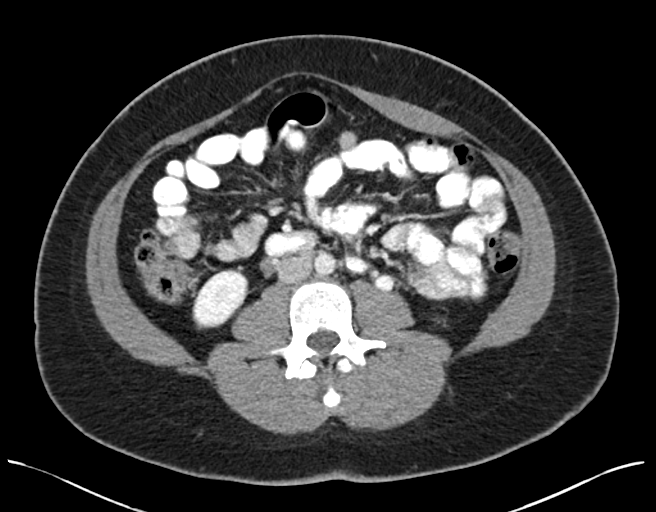
[im 56/88  soft-tissue]
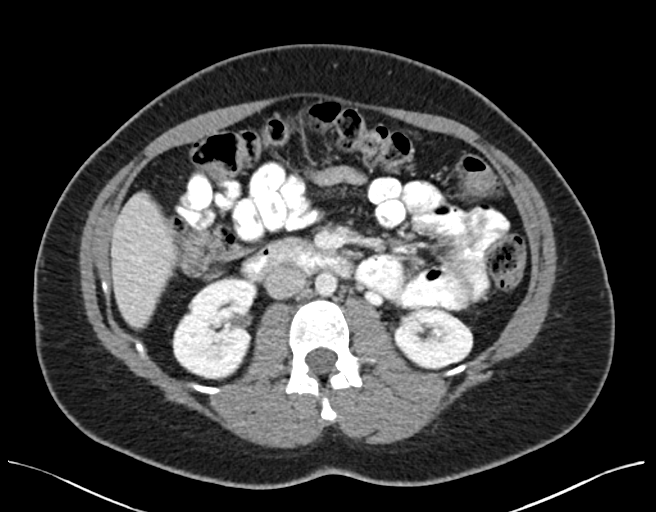
[im 67/88  soft-tissue]
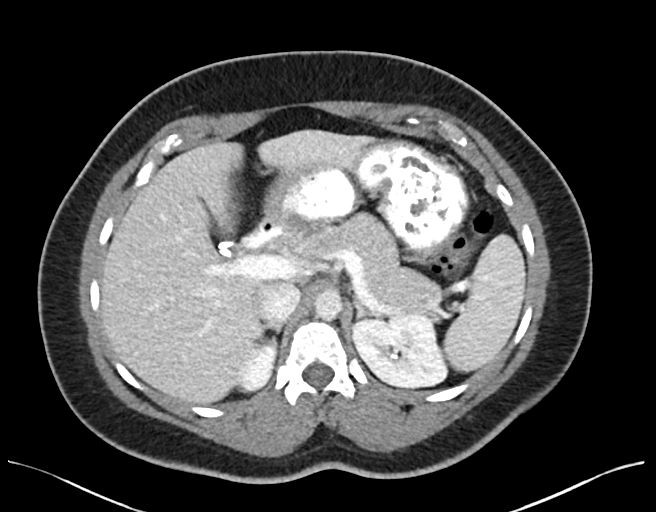
[im 74/88  soft-tissue]
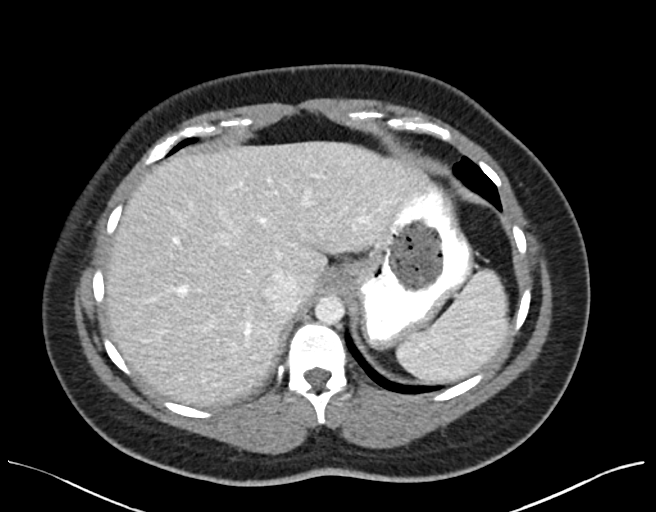
[im 74/88  bone]
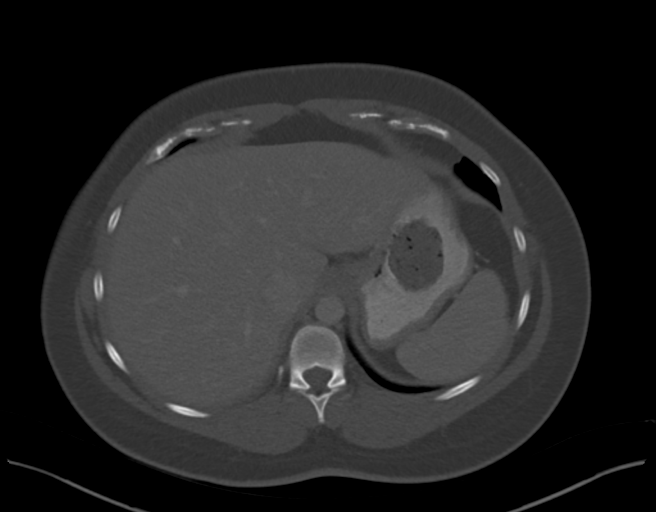
[im 81/88  soft-tissue]
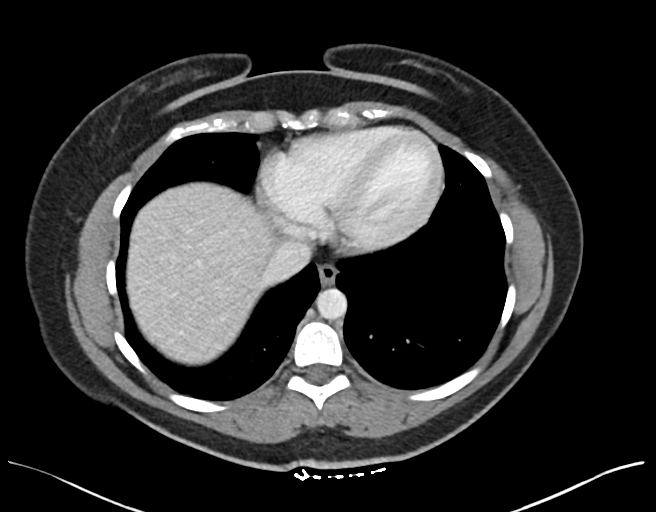

[Series 6: abd pelvis 2.00 br40 s3 cor · coronal · 0.80mm/px · 3 of 151 slices shown]
[im 51/151  soft-tissue]
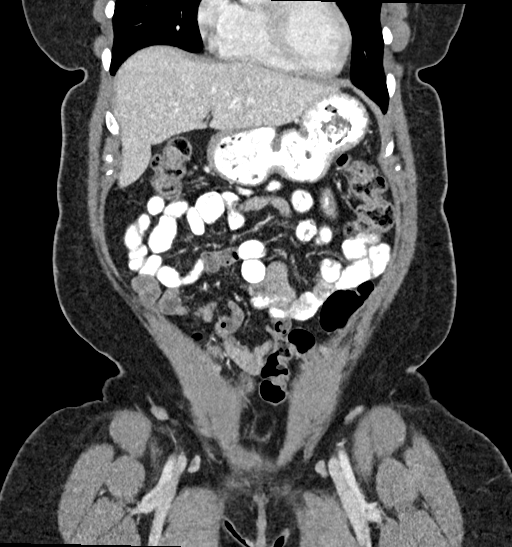
[im 67/151  soft-tissue]
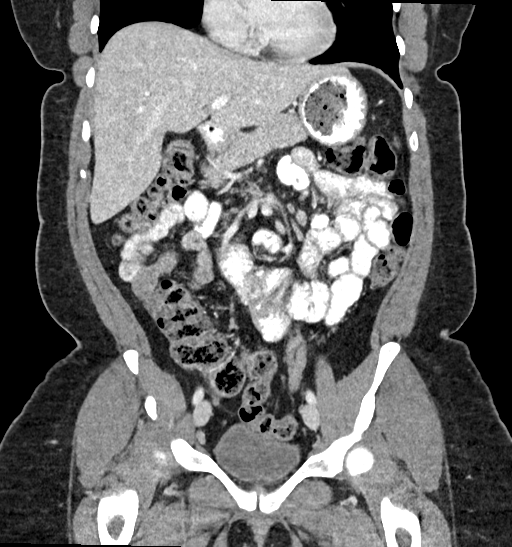
[im 84/151  soft-tissue]
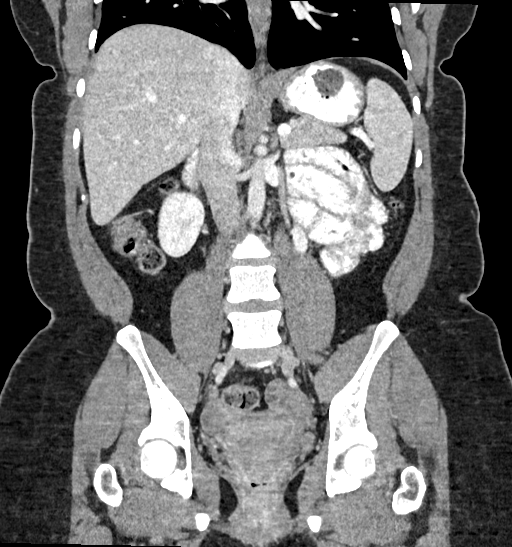

[13 of 46 positions shown; findings below may reference images not displayed]

RADIATION DOSE REDUCTION: This exam was performed according to the
departmental dose-optimization program which includes automated
exposure control, adjustment of the mA and/or kV according to
patient size and/or use of iterative reconstruction technique.

CONTRAST:  100mL 3LYZZ3-HLL IOPAMIDOL (3LYZZ3-HLL) INJECTION 61%
FINDINGS: Lower chest: No acute abnormality.

Hepatobiliary: No suspicious hepatic lesion. Gallbladder surgically
absent. No biliary ductal dilation.

Pancreas: No pancreatic ductal dilation or evidence of acute
inflammation.

Spleen: No splenomegaly or focal splenic lesion.

Adrenals/Urinary Tract: Bilateral adrenal glands appear normal. No
hydronephrosis. Punctate bilateral nonobstructive renal stones.
Symmetric bilateral renal enhancement. Urinary bladder is
unremarkable for degree of distension.

Stomach/Bowel: Radiopaque enteric contrast material traverses distal
loops of small bowel. Stomach is unremarkable for degree of
distension. No pathologic dilation small or large bowel. The
appendix is surgically absent. No evidence of acute bowel
inflammation.

Vascular/Lymphatic: No significant vascular findings are present. No
pathologically enlarged abdominal or pelvic lymph nodes.

Reproductive: Uterus and bilateral adnexa are unremarkable in CT
appearance for reproductive age female.

Other: Mild anterior abdominal wall laxity with a small fat
containing ventral hernia.

Musculoskeletal: No acute or significant osseous findings.
IMPRESSION: 1. No acute abdominopelvic findings.
2. Mild anterior abdominal wall laxity with a small fat containing
ventral hernia.
3. Punctate bilateral nonobstructive renal stones.

## 2023-06-16 ENCOUNTER — Other Ambulatory Visit: Payer: Self-pay | Admitting: Family Medicine

## 2023-06-16 DIAGNOSIS — E559 Vitamin D deficiency, unspecified: Secondary | ICD-10-CM

## 2023-10-13 ENCOUNTER — Other Ambulatory Visit: Payer: Self-pay | Admitting: Gastroenterology
